# Patient Record
Sex: Male | Born: 1990 | Race: Black or African American | Hispanic: No | Marital: Single | Smoking: Former smoker
Health system: Southern US, Community
[De-identification: ages and names within clinical notes are randomized; demographics above are authoritative.]

## PROBLEM LIST (undated history)

## (undated) DIAGNOSIS — J45909 Unspecified asthma, uncomplicated: Secondary | ICD-10-CM

---
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
## 2012-04-25 ENCOUNTER — Emergency Department: Payer: Self-pay | Admitting: Emergency Medicine

## 2012-04-25 LAB — CBC
HCT: 46.5 % (ref 40.0–52.0)
MCH: 28.9 pg (ref 26.0–34.0)
MCHC: 33.6 g/dL (ref 32.0–36.0)
MCV: 86 fL (ref 80–100)
Platelet: 266 10*3/uL (ref 150–440)
RDW: 14.1 % (ref 11.5–14.5)
WBC: 6.6 10*3/uL (ref 3.8–10.6)

## 2012-04-25 LAB — COMPREHENSIVE METABOLIC PANEL
Albumin: 4 g/dL (ref 3.8–5.6)
Alkaline Phosphatase: 67 U/L — ABNORMAL LOW (ref 98–317)
Anion Gap: 8 (ref 7–16)
BUN: 16 mg/dL (ref 7–18)
Calcium, Total: 8.8 mg/dL — ABNORMAL LOW (ref 9.0–10.7)
Co2: 24 mmol/L (ref 21–32)
Creatinine: 1.39 mg/dL — ABNORMAL HIGH (ref 0.60–1.30)
EGFR (Non-African Amer.): >60
Osmolality: 277 (ref 275–301)
Potassium: 3.8 mmol/L (ref 3.5–5.1)
SGOT(AST): 43 U/L — ABNORMAL HIGH (ref 10–41)
Sodium: 138 mmol/L (ref 136–145)
Total Protein: 7.7 g/dL (ref 6.4–8.6)

## 2012-04-28 LAB — BETA STREP CULTURE(ARMC)

## 2012-05-01 LAB — CULTURE, BLOOD (SINGLE)

## 2012-07-30 ENCOUNTER — Emergency Department: Payer: Self-pay | Admitting: Emergency Medicine

## 2012-08-27 ENCOUNTER — Emergency Department: Payer: Self-pay | Admitting: Emergency Medicine

## 2012-08-27 LAB — TROPONIN I: Troponin-I: <0.02 ng/mL

## 2012-08-27 LAB — BASIC METABOLIC PANEL
Anion Gap: 9 (ref 7–16)
BUN: 24 mg/dL — ABNORMAL HIGH (ref 7–18)
Chloride: 110 mmol/L — ABNORMAL HIGH (ref 98–107)
Co2: 21 mmol/L (ref 21–32)
Creatinine: 1.12 mg/dL (ref 0.60–1.30)
EGFR (African American): >60
EGFR (Non-African Amer.): >60
Glucose: 93 mg/dL (ref 65–99)
Osmolality: 283 (ref 275–301)
Potassium: 4.1 mmol/L (ref 3.5–5.1)

## 2012-08-27 LAB — CBC
HCT: 44.2 % (ref 40.0–52.0)
MCH: 28.8 pg (ref 26.0–34.0)
MCHC: 34.5 g/dL (ref 32.0–36.0)
Platelet: 277 10*3/uL (ref 150–440)
RBC: 5.29 10*6/uL (ref 4.40–5.90)

## 2012-08-27 LAB — CK TOTAL AND CKMB (NOT AT ARMC): CK, Total: 527 U/L — ABNORMAL HIGH (ref 35–232)

## 2012-10-08 ENCOUNTER — Emergency Department: Payer: Self-pay | Admitting: Emergency Medicine

## 2014-04-01 IMAGING — CR DG CHEST 2V
1 series · 2 of 2 positions shown · non-contrast
Comparison: none

REASON FOR EXAM: chest pain
COMMENTS:   May transport without cardiac monitor

PROCEDURE:     DXR - DXR CHEST PA (OR AP) AND LATERAL  - August 27, 2012 [DATE]
RESULT:     Comparison: None

[Series 1: w chest pa · 0.14mm/px · 2 of 2 slices shown]
[im 1/2]
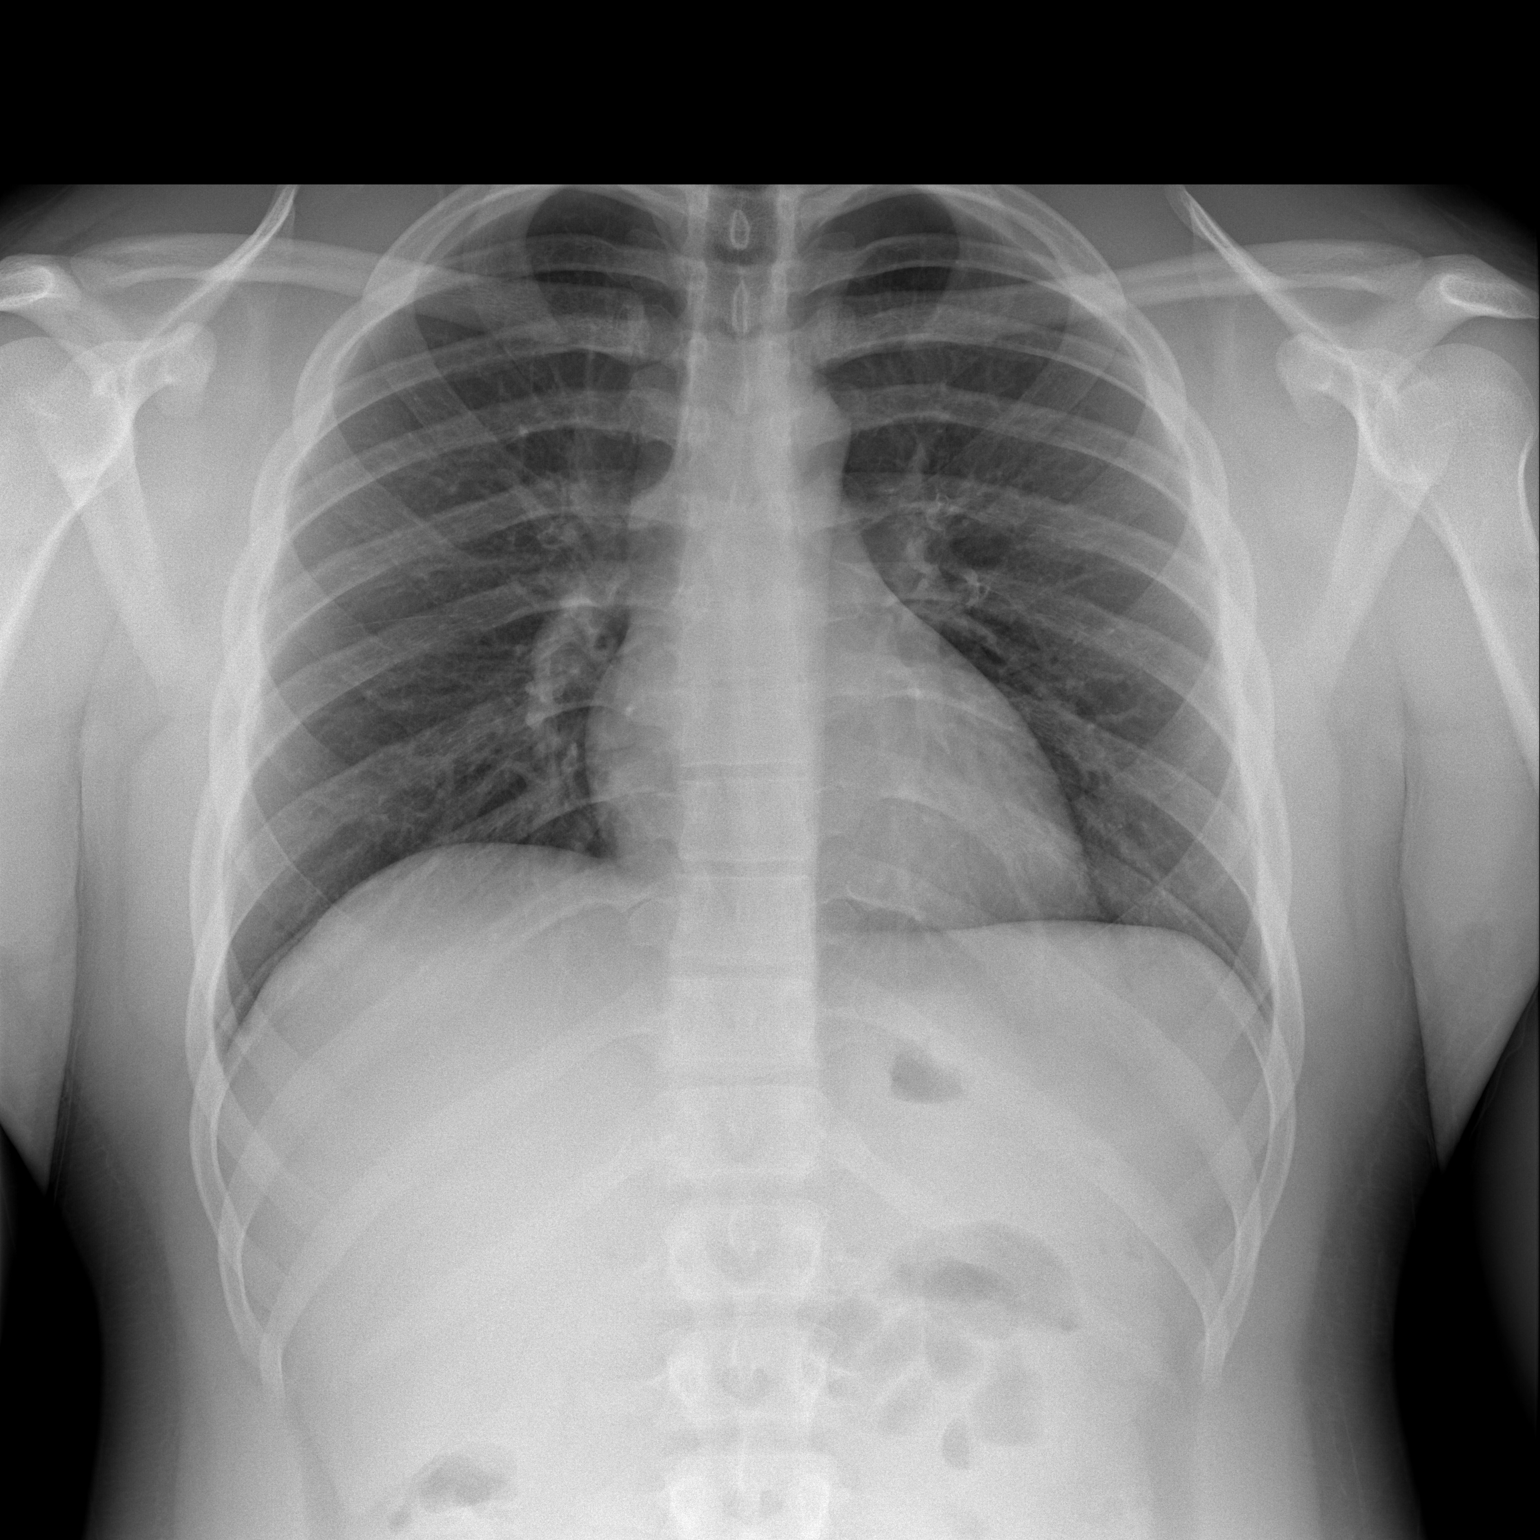
[im 2/2]
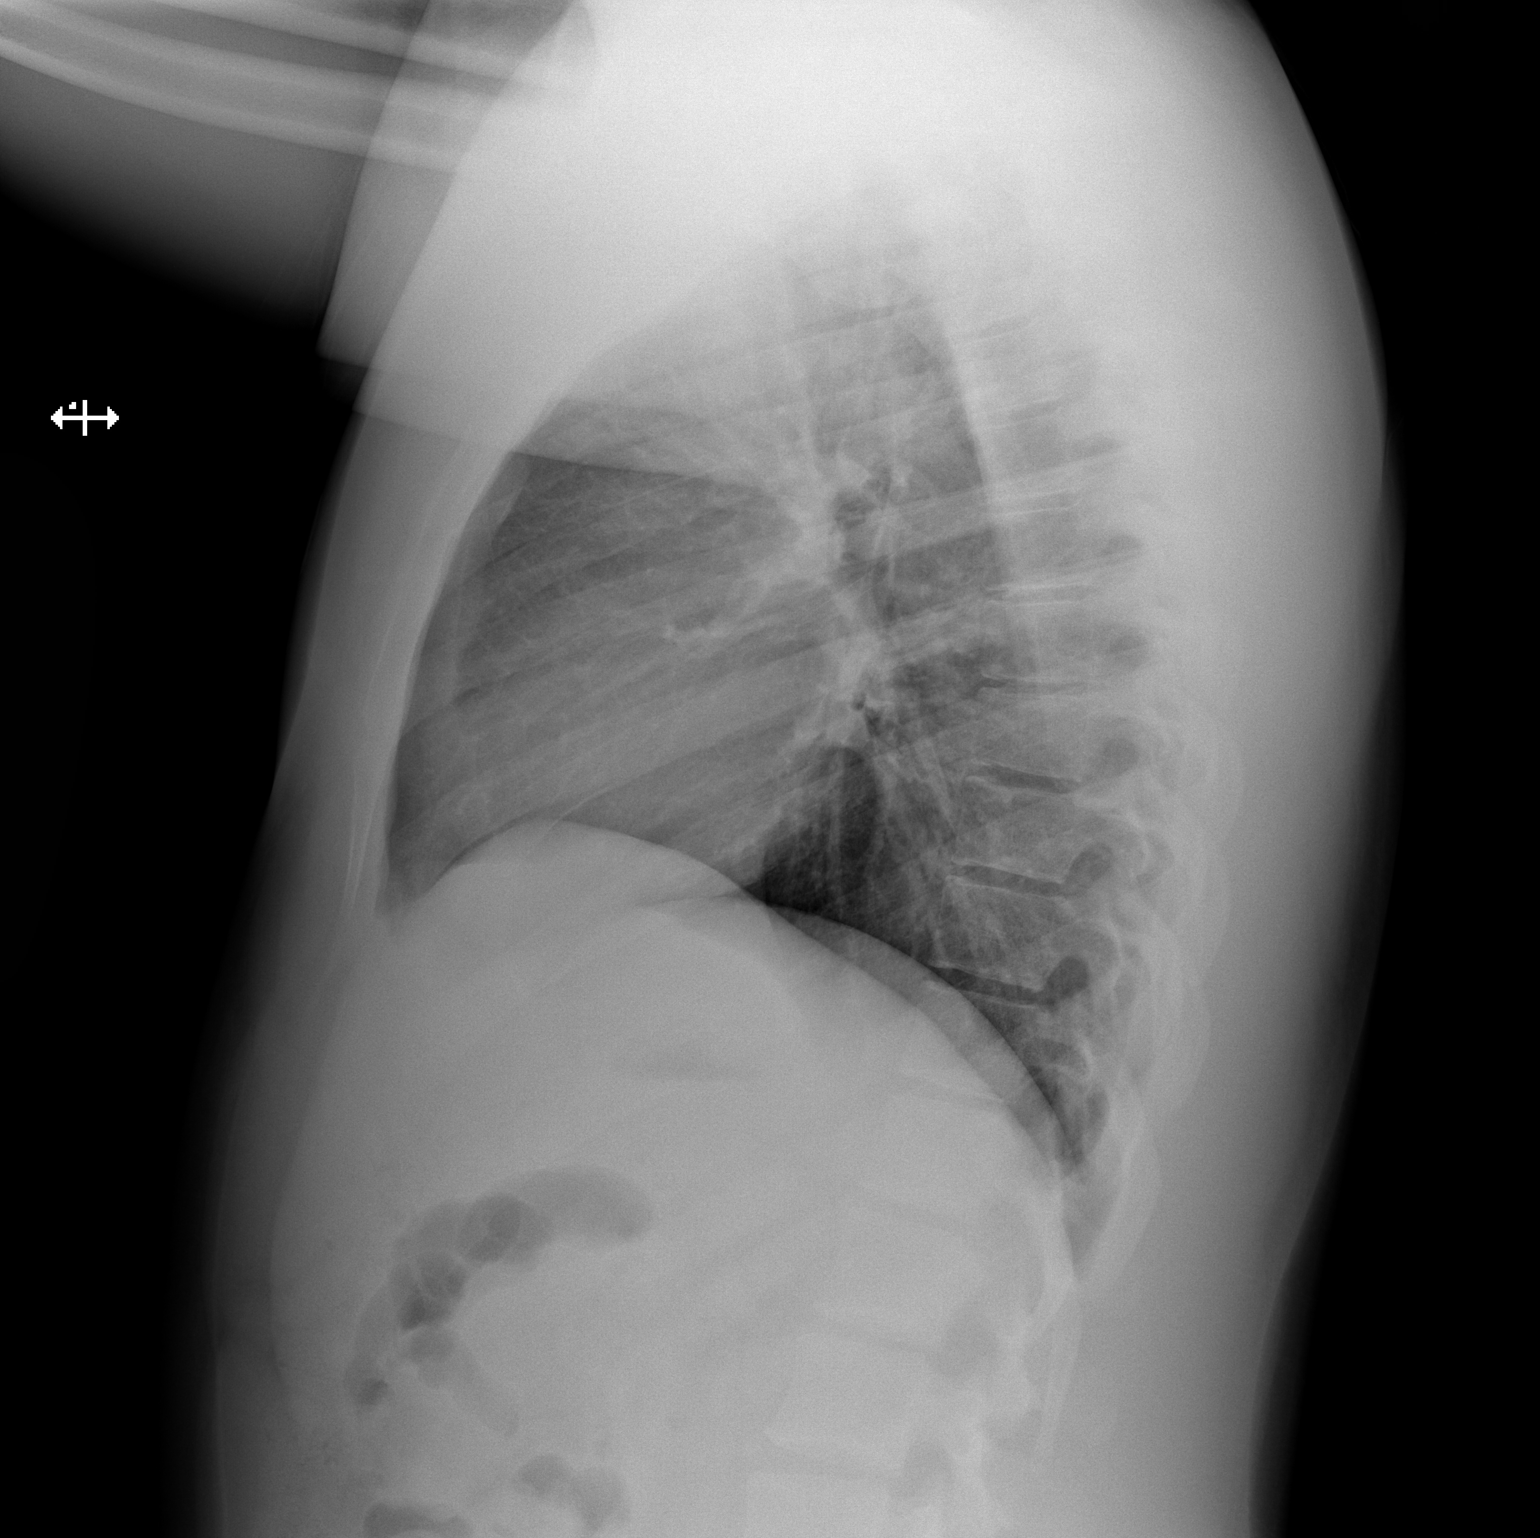

[2 of 2 positions shown; findings below may reference images not displayed]

FINDINGS: PA and lateral chest radiographs are provided.  There is no focal
parenchymal opacity, pleural effusion, or pneumothorax. The heart and
mediastinum are unremarkable.  The osseous structures are unremarkable.
IMPRESSION: No acute disease of the che[REDACTED]

## 2015-04-28 ENCOUNTER — Encounter: Payer: Self-pay | Admitting: Urgent Care

## 2015-04-28 ENCOUNTER — Emergency Department
Admission: EM | Admit: 2015-04-28 | Discharge: 2015-04-28 | Disposition: A | Discharge status: Home / Self Care | Payer: Medicaid Other | Attending: Emergency Medicine | Admitting: Emergency Medicine

## 2015-04-28 DIAGNOSIS — I10 Essential (primary) hypertension: Secondary | ICD-10-CM | POA: Insufficient documentation

## 2015-04-28 DIAGNOSIS — R040 Epistaxis: Secondary | ICD-10-CM | POA: Insufficient documentation

## 2015-04-28 HISTORY — DX: Unspecified asthma, uncomplicated: J45.909

## 2015-04-28 MED ORDER — HYDROCHLOROTHIAZIDE 12.5 MG PO TABS: 12.5000 mg | ORAL_TABLET | Freq: Every day | ORAL | Status: DC

## 2015-04-28 MED ORDER — OXYMETAZOLINE HCL 0.05 % NA SOLN
1.0000 | Freq: Once | NASAL | Status: AC
  Administered 2015-04-28: 1 via NASAL
  Filled 2015-04-28: qty 15

## 2015-04-28 MED ORDER — HYDROCHLOROTHIAZIDE 25 MG PO TABS
12.5000 mg | ORAL_TABLET | Freq: Once | ORAL | Status: AC
Start: 2015-04-28 — End: 2015-04-28
  Administered 2015-04-28: 12.5 mg via ORAL
  Filled 2015-04-28: qty 0.5

## 2015-04-28 NOTE — ED Provider Notes (Signed)
[facility] Emergency Department Provider Note  Time seen: 8:45 PM  I have reviewed the triage vital signs and the nursing notes.   HISTORY  Chief Complaint Epistaxis    HPI Darren Kim is a 24 y.o. male with a past medical history of hypertension presents the emergency department with elevated blood pressure and. According to the patient for the past 3 days his blood pressures been elevated in the 150s. Also for the last 3 days he has had an intermittent nosebleed which he states is on the left side nostril. No bleeding currently. However he came to the emergency department today because he sneezed and had a fairly large nosebleed. Denies any blood thinners, denies any aspirin use. Denies any dizziness or lightheadedness. Does state a mild headache currently. Patient sees [facility] is his primary care physician.     Past Medical History  Diagnosis Date  . Asthma     There are no active problems to display for this patient.   History reviewed. No pertinent past surgical history.  No current outpatient prescriptions on file.  Allergies Review of patient's allergies indicates no known allergies.  No family history on file.  Social History Social History  Substance Use Topics  . Smoking status: Never Smoker   . Smokeless tobacco: None  . Alcohol Use: Yes    Review of Systems Constitutional: Negative for fever. Cardiovascular: Negative for chest pain. Respiratory: Negative for shortness of breath. Gastrointestinal: Negative for abdominal pain Neurological: Negative for headache 10-point ROS otherwise negative.  ____________________________________________   PHYSICAL EXAM:  VITAL SIGNS: ED Triage Vitals  Enc Vitals Group     BP 04/28/15 2006 143/83 mmHg     Pulse Rate 04/28/15 2006 85     Resp 04/28/15 2006 16     Temp 04/28/15 2006 98.3 F (36.8 C)     Temp Source 04/28/15 2006 Oral     SpO2 04/28/15 2006 96 %   Weight 04/28/15 2006 218 lb (98.884 kg)     Height 04/28/15 2006  (1.676 m)     Head Cir --      Peak Flow --      Pain Score 04/28/15 2007 8     Pain Loc --      Pain Edu? --      Excl. in GC? --     Constitutional: Alert and oriented. Well appearing and in no distress. Eyes: Normal exam ENT   Head: Normocephalic and atraumatic.   Mouth/Throat: Mucous membranes are moist. Small amount of dried blood in the left nares, no active bleeding. No other abnormalities noted. Cardiovascular: Normal rate, regular rhythm. No murmur Respiratory: Normal respiratory effort without tachypnea nor retractions. Breath sounds are clear  Gastrointestinal: Soft and nontender. No distention.   Musculoskeletal: Nontender with normal range of motion in all extremities.  Neurologic:  Normal speech and language. No gross focal neurologic deficits Skin:  Skin is warm, dry and intact.  Psychiatric: Mood and affect are normal. Speech and behavior are normal.  ____________________________________________    INITIAL IMPRESSION / ASSESSMENT AND PLAN / ED COURSE  Pertinent labs & imaging results that were available during my care of the patient were reviewed by me and considered in my medical decision making (see chart for details).  Patient presents with epistaxis as well as hypertension. States his blood pressure has been elevated in the past these never been prescribed a medication. Given his epistaxis we'll provide the patient with  Afrin which he can take, fevers any further nosebleeds, and described the patient had used the Afrin clamp his nose for 15 minutes without checking. Patient is agreeable. We'll also start the patient hydrochlorothiazide, patient is to follow-up with his primary care physician in 2 weeks for recheck. Patient is agreeable to plan.  ____________________________________________   FINAL CLINICAL IMPRESSION(S) / ED DIAGNOSES  Hypertension Epistaxis   Minna AntisKevin Maela Takeda,  MD 04/28/15 2048

## 2015-04-28 NOTE — ED Notes (Addendum)
Patient presents with reports of epistaxis events for the last three days. Patient has been bleeding from both nares; no active bleeding at present. Also reporting elevated BP at home; BPs report in the 150s / low 100s; presents 143/83 tonight in triage.

## 2015-04-28 NOTE — Discharge Instructions (Signed)
Hypertension °Hypertension, commonly called high blood pressure, is when the force of blood pumping through your arteries is too strong. Your arteries are the blood vessels that carry blood from your heart throughout your body. A blood pressure reading consists of a higher number over a lower number, such as 110/72. The higher number (systolic) is the pressure inside your arteries when your heart pumps. The lower number (diastolic) is the pressure inside your arteries when your heart relaxes. Ideally you want your blood pressure below 120/80. °Hypertension forces your heart to work harder to pump blood. Your arteries may become narrow or stiff. Having untreated or uncontrolled hypertension can cause heart attack, stroke, kidney disease, and other problems. °RISK FACTORS °Some risk factors for high blood pressure are controllable. Others are not.  °Risk factors you cannot control include:  °· Race. You may be at higher risk if you are African American. °· Age. Risk increases with age. °· Gender. Men are at higher risk than women before age 45 years. After age 65, women are at higher risk than men. °Risk factors you can control include: °· Not getting enough exercise or physical activity. °· Being overweight. °· Getting too much fat, sugar, calories, or salt in your diet. °· Drinking too much alcohol. °SIGNS AND SYMPTOMS °Hypertension does not usually cause signs or symptoms. Extremely high blood pressure (hypertensive crisis) may cause headache, anxiety, shortness of breath, and nosebleed. °DIAGNOSIS °To check if you have hypertension, your health care provider will measure your blood pressure while you are seated, with your arm held at the level of your heart. It should be measured at least twice using the same arm. Certain conditions can cause a difference in blood pressure between your right and left arms. A blood pressure reading that is higher than normal on one occasion does not mean that you need treatment. If  it is not clear whether you have high blood pressure, you may be asked to return on a different day to have your blood pressure checked again. Or, you may be asked to monitor your blood pressure at home for 1 or more weeks. °TREATMENT °Treating high blood pressure includes making lifestyle changes and possibly taking medicine. Living a healthy lifestyle can help lower high blood pressure. You may need to change some of your habits. °Lifestyle changes may include: °· Following the DASH diet. This diet is high in fruits, vegetables, and whole grains. It is low in salt, red meat, and added sugars. °· Keep your sodium intake below 2,300 mg per day. °· Getting at least 30-45 minutes of aerobic exercise at least 4 times per week. °· Losing weight if necessary. °· Not smoking. °· Limiting alcoholic beverages. °· Learning ways to reduce stress. °Your health care provider may prescribe medicine if lifestyle changes are not enough to get your blood pressure under control, and if one of the following is true: °· You are 18-59 years of age and your systolic blood pressure is above 140. °· You are 60 years of age or older, and your systolic blood pressure is above 150. °· Your diastolic blood pressure is above 90. °· You have diabetes, and your systolic blood pressure is over 140 or your diastolic blood pressure is over 90. °· You have kidney disease and your blood pressure is above 140/90. °· You have heart disease and your blood pressure is above 140/90. °Your personal target blood pressure may vary depending on your medical conditions, your age, and other factors. °HOME CARE INSTRUCTIONS °·   Have your blood pressure rechecked as directed by your health care provider.   °· Take medicines only as directed by your health care provider. Follow the directions carefully. Blood pressure medicines must be taken as prescribed. The medicine does not work as well when you skip doses. Skipping doses also puts you at risk for  problems. °· Do not smoke.   °· Monitor your blood pressure at home as directed by your health care provider.  °SEEK MEDICAL CARE IF:  °· You think you are having a reaction to medicines taken. °· You have recurrent headaches or feel dizzy. °· You have swelling in your ankles. °· You have trouble with your vision. °SEEK IMMEDIATE MEDICAL CARE IF: °· You develop a severe headache or confusion. °· You have unusual weakness, numbness, or feel faint. °· You have severe chest or abdominal pain. °· You vomit repeatedly. °· You have trouble breathing. °MAKE SURE YOU:  °· Understand these instructions. °· Will watch your condition. °· Will get help right away if you are not doing well or get worse. °  °This information is not intended to replace advice given to you by your health care provider. Make sure you discuss any questions you have with your health care provider. °  °Document Released: 04/16/2005 Document Revised: 08/31/2014 Document Reviewed: 02/06/2013 °Elsevier Interactive Patient Education ©2016 [employer] ° °Nosebleed °Nosebleeds are common. They are due to a crack in the inside lining of your nose (mucous membrane) or from a small blood vessel that starts to bleed. Nosebleeds can be caused by many conditions, such as injury, infections, dry mucous membranes or dry climate, medicines, nose picking, and home heating and cooling systems. Most nosebleeds come from blood vessels in the front of your nose. °HOME CARE INSTRUCTIONS  °· Try controlling your nosebleed by pinching your nostrils gently and continuously for at least 10 minutes. °· Avoid blowing or sniffing your nose for a number of hours after having a nosebleed. °· Do not put gauze inside your nose yourself. If your nose was packed by your health care provider, try to maintain the pack inside of your nose until your health care provider removes it. °¨ If a gauze pack was used and it starts to fall out, gently replace it or cut off the end of it. °¨ If a  balloon catheter was used to pack your nose, do not cut or remove it unless your health care provider has instructed you to do that. °· Avoid lying down while you are having a nosebleed. Sit up and lean forward. °· Use a nasal spray decongestant to help with a nosebleed as directed by your health care provider. °· Do not use petroleum jelly or mineral oil in your nose. These can drip into your lungs. °· Maintain humidity in your home by using less air conditioning or by using a humidifier. °· Aspirin and blood thinners make bleeding more likely. If you are prescribed these medicines and you suffer from nosebleeds, ask your health care provider if you should stop taking the medicines or adjust the dose. Do not stop medicines unless directed by your health care provider °· Resume your normal activities as you are able, but avoid straining, lifting, or bending at the waist for several days. °· If your nosebleed was caused by dry mucous membranes, use over-the-counter saline nasal spray or gel. This will keep the mucous membranes moist and allow them to heal. If you must use a lubricant, choose the water-soluble variety. Use it only sparingly, and   do not use it within several hours of lying down. °· Keep all follow-up visits as directed by your health care provider. This is important. °SEEK MEDICAL CARE IF: °· You have a fever. °· You get frequent nosebleeds. °· You are getting nosebleeds more often. °SEEK IMMEDIATE MEDICAL CARE IF: °· Your nosebleed lasts longer than 20 minutes. °· Your nosebleed occurs after an injury to your face, and your nose looks crooked or broken. °· You have unusual bleeding from other parts of your body. °· You have unusual bruising on other parts of your body. °· You feel light-headed or you faint. °· You become sweaty. °· You vomit blood. °· Your nosebleed occurs after a head injury. °  °This information is not intended to replace advice given to you by your health care provider. Make sure  you discuss any questions you have with your health care provider. °  °Document Released: 01/24/2005 Document Revised: 05/07/2014 Document Reviewed: 11/30/2013 °Elsevier Interactive Patient Education ©2016 Elsevier Inc. ° °

## 2016-10-03 ENCOUNTER — Emergency Department: Payer: Self-pay

## 2016-10-03 ENCOUNTER — Encounter: Payer: Self-pay | Admitting: Emergency Medicine

## 2016-10-03 ENCOUNTER — Emergency Department
Admission: EM | Admit: 2016-10-03 | Discharge: 2016-10-03 | Disposition: A | Discharge status: Home / Self Care | Payer: Self-pay | Attending: Emergency Medicine | Admitting: Emergency Medicine

## 2016-10-03 DIAGNOSIS — Z87891 Personal history of nicotine dependence: Secondary | ICD-10-CM | POA: Insufficient documentation

## 2016-10-03 DIAGNOSIS — J45998 Other asthma: Secondary | ICD-10-CM | POA: Insufficient documentation

## 2016-10-03 DIAGNOSIS — J45909 Unspecified asthma, uncomplicated: Secondary | ICD-10-CM

## 2016-10-03 DIAGNOSIS — Z79899 Other long term (current) drug therapy: Secondary | ICD-10-CM | POA: Insufficient documentation

## 2016-10-03 DIAGNOSIS — R0789 Other chest pain: Secondary | ICD-10-CM

## 2016-10-03 LAB — COMPREHENSIVE METABOLIC PANEL
ALT: 45 U/L (ref 17–63)
AST: 36 U/L (ref 15–41)
Albumin: 4.1 g/dL (ref 3.5–5.0)
Alkaline Phosphatase: 43 U/L (ref 38–126)
Anion gap: 11 (ref 5–15)
BUN: 24 mg/dL — ABNORMAL HIGH (ref 6–20)
CHLORIDE: 107 mmol/L (ref 101–111)
CO2: 21 mmol/L — ABNORMAL LOW (ref 22–32)
Calcium: 9.3 mg/dL (ref 8.9–10.3)
Creatinine, Ser: 1.41 mg/dL — ABNORMAL HIGH (ref 0.61–1.24)
GFR calc Af Amer: >60 mL/min (ref >60)
Glucose, Bld: 93 mg/dL (ref 65–99)
Potassium: 3.9 mmol/L (ref 3.5–5.1)
Sodium: 139 mmol/L (ref 135–145)
Total Bilirubin: 1.3 mg/dL — ABNORMAL HIGH (ref 0.3–1.2)
Total Protein: 7.5 g/dL (ref 6.5–8.1)

## 2016-10-03 LAB — URINALYSIS, COMPLETE (UACMP) WITH MICROSCOPIC
BACTERIA UA: NONE SEEN
Bilirubin Urine: NEGATIVE
Glucose, UA: NEGATIVE mg/dL
HGB URINE DIPSTICK: NEGATIVE
Ketones, ur: NEGATIVE mg/dL
Leukocytes, UA: NEGATIVE
Nitrite: NEGATIVE
Protein, ur: NEGATIVE mg/dL
SPECIFIC GRAVITY, URINE: 1.011 (ref 1.005–1.030)
Squamous Epithelial / LPF: NONE SEEN
pH: 6 (ref 5.0–8.0)

## 2016-10-03 LAB — CBC WITH DIFFERENTIAL/PLATELET
Basophils Absolute: 0.1 10*3/uL (ref 0–0.1)
Basophils Relative: 1 %
Eosinophils Absolute: 0.1 10*3/uL (ref 0–0.7)
Eosinophils Relative: 1 %
HEMATOCRIT: 50.7 % (ref 40.0–52.0)
HEMOGLOBIN: 18.1 g/dL — AB (ref 13.0–18.0)
LYMPHS ABS: 5.1 10*3/uL — AB (ref 1.0–3.6)
LYMPHS PCT: 49 %
MCH: 29.9 pg (ref 26.0–34.0)
MCHC: 35.8 g/dL (ref 32.0–36.0)
MCV: 83.6 fL (ref 80.0–100.0)
Monocytes Absolute: 0.6 10*3/uL (ref 0.2–1.0)
Monocytes Relative: 6 %
NEUTROS ABS: 4.4 10*3/uL (ref 1.4–6.5)
NEUTROS PCT: 43 %
Platelets: 302 10*3/uL (ref 150–440)
RBC: 6.06 MIL/uL — AB (ref 4.40–5.90)
RDW: 13.7 % (ref 11.5–14.5)
WBC: 10.2 10*3/uL (ref 3.8–10.6)

## 2016-10-03 LAB — URINE DRUG SCREEN, QUALITATIVE (ARMC ONLY)
Amphetamines, Ur Screen: NOT DETECTED
Barbiturates, Ur Screen: NOT DETECTED
Benzodiazepine, Ur Scrn: NOT DETECTED
CANNABINOID 50 NG, UR ~~LOC~~: NOT DETECTED
Cocaine Metabolite,Ur ~~LOC~~: NOT DETECTED
MDMA (ECSTASY) UR SCREEN: NOT DETECTED
Methadone Scn, Ur: NOT DETECTED
Opiate, Ur Screen: POSITIVE — AB
Phencyclidine (PCP) Ur S: NOT DETECTED
Tricyclic, Ur Screen: NOT DETECTED

## 2016-10-03 LAB — ETHANOL

## 2016-10-03 LAB — TROPONIN I

## 2016-10-03 LAB — FIBRIN DERIVATIVES D-DIMER (ARMC ONLY): FIBRIN DERIVATIVES D-DIMER (ARMC): 145.57 (ref 0.00–499.00)

## 2016-10-03 MED ORDER — SODIUM CHLORIDE 0.9 % IV BOLUS (SEPSIS)
500.0000 mL | Freq: Once | INTRAVENOUS | Status: AC
  Administered 2016-10-03: 500 mL via INTRAVENOUS

## 2016-10-03 MED ORDER — KETOROLAC TROMETHAMINE 30 MG/ML IJ SOLN
15.0000 mg | Freq: Once | INTRAMUSCULAR | Status: AC
  Administered 2016-10-03: 15 mg via INTRAVENOUS
  Filled 2016-10-03: qty 1

## 2016-10-03 MED ORDER — PREDNISONE 20 MG PO TABS: 60.0000 mg | ORAL_TABLET | Freq: Every day | ORAL | 0 refills | Status: DC

## 2016-10-03 MED ORDER — NAPROXEN SODIUM 220 MG PO TABS: 220.0000 mg | ORAL_TABLET | Freq: Two times a day (BID) | ORAL | 0 refills | Status: DC

## 2016-10-03 MED ORDER — IPRATROPIUM-ALBUTEROL 0.5-2.5 (3) MG/3ML IN SOLN
3.0000 mL | Freq: Once | RESPIRATORY_TRACT | Status: AC
  Administered 2016-10-03: 3 mL via RESPIRATORY_TRACT

## 2016-10-03 MED ORDER — ALBUTEROL SULFATE HFA 108 (90 BASE) MCG/ACT IN AERS: 2.0000 | INHALATION_SPRAY | Freq: Four times a day (QID) | RESPIRATORY_TRACT | 2 refills | Status: DC | PRN

## 2016-10-03 MED ORDER — IPRATROPIUM-ALBUTEROL 0.5-2.5 (3) MG/3ML IN SOLN
RESPIRATORY_TRACT | Status: AC
  Filled 2016-10-03: qty 3

## 2016-10-03 MED ORDER — MORPHINE SULFATE (PF) 4 MG/ML IV SOLN
4.0000 mg | Freq: Once | INTRAVENOUS | Status: AC
  Administered 2016-10-03: 4 mg via INTRAVENOUS
  Filled 2016-10-03: qty 1

## 2016-10-03 MED ORDER — IPRATROPIUM-ALBUTEROL 0.5-2.5 (3) MG/3ML IN SOLN
RESPIRATORY_TRACT | Status: AC
  Administered 2016-10-03: 3 mL via RESPIRATORY_TRACT
  Filled 2016-10-03: qty 3

## 2016-10-03 MED ORDER — OXYCODONE-ACETAMINOPHEN 5-325 MG PO TABS: 1.0000 | ORAL_TABLET | Freq: Four times a day (QID) | ORAL | 0 refills | Status: DC | PRN

## 2016-10-03 NOTE — ED Provider Notes (Addendum)
[facility] Emergency Department Provider Note  ____________________________________________   I have reviewed the triage vital signs and the nursing notes.   HISTORY  Chief Complaint Shortness of Breath    HPI Darren Kim is a 26 y.o. male who has a history of asthma, as well as a daily dose of HCTZ for presumable hypertension, presents today complaining of chest pain. Patient has had asthma off and on for several weeks, and he has a recurrent pain in his right chest wall which is worse when he coughs and changes position or touches it. He states it happened when he was coughing a few weeks ago and seems against getting worse and not better. His been constant. Worse over the last 2 days. The sharp nonradiating pain. Discussed by the exact spot where it hurts. Is worse when he changes position as noted above. Nothing else makes it better side lying still and not coughing. He denies any fever or productive cough. He states his asthma is mild at this time. He has no leg swelling. No recent travel, no personal or family history of PE or DVT. The pain is uncomfortable for him. He denies any recent surgery. He has no family history of early cardiac death.  Denies cocaine abuse.   Past Medical History:  Diagnosis Date  . Asthma     There are no active problems to display for this patient.   History reviewed. No pertinent surgical history.  Prior to Admission medications   Medication Sig Start Date End Date Taking? Authorizing Provider  hydrochlorothiazide (HYDRODIURIL) 12.5 MG tablet Take 1 tablet (12.5 mg total) by mouth daily. 04/28/15   Minna Antis, MD    Allergies Patient has no known allergies.  No family history on file.  Social History Social History  Substance Use Topics  . Smoking status: Former Smoker    Types: Cigarettes    Quit date: 09/12/2016  . Smokeless tobacco: Not on file  . Alcohol use Yes    Review of  Systems Constitutional: No fever/chills Eyes: No visual changes. ENT: No sore throat. No stiff neck no neck pain Cardiovascular: Positive chest pain. Respiratory: Positive mild shortness of breath. Gastrointestinal:   no vomiting.  No diarrhea.  No constipation. Genitourinary: Negative for dysuria. Musculoskeletal: Negative lower extremity swelling Skin: Negative for rash. Neurological: Negative for severe headaches, focal weakness or numbness.   ____________________________________________   PHYSICAL EXAM:  VITAL SIGNS: ED Triage Vitals  Enc Vitals Group     BP 10/03/16 0829 (!) 124/92     Pulse Rate 10/03/16 0829 75     Resp 10/03/16 0829 20     Temp 10/03/16 0829 98.2 F (36.8 C)     Temp Source 10/03/16 0829 Oral     SpO2 10/03/16 0829 100 %     Weight 10/03/16 0831 227 lb (103 kg)     Height 10/03/16 0831 5\' 6"  (1.676 m)     Head Circumference --      Peak Flow --      Pain Score 10/03/16 0829 10     Pain Loc --      Pain Edu? --      Excl. in GC? --     Constitutional: Alert and oriented. Well appearing and in no acute distress.Patient is anxious and upset but nontoxic medically Eyes: Conjunctivae are normal Head: Atraumatic HEENT: No congestion/rhinnorhea. Mucous membranes are moist.  Oropharynx non-erythematous Neck:   Nontender with no meningismus, no masses, no stridor  Cardiovascular: Normal rate, regular rhythm. Grossly normal heart sounds.  Good peripheral circulation. Respiratory: Normal respiratory effort.  No retractions. Lungs occasional slight bilateral wheeze,  chest: Focal tenderness or right chest wall and I touch that area patient states "that's the pain right there" and pulls back. There is no crepitus, there is no flail chest, there is no lesions or inflammation noted there is no rib fracture palpated. Pain is also worse when the patient pulls up in the bed.  Abdominal: Soft and nontender. No distention. No guarding no rebound Back:  There is no  focal tenderness or step off.  there is no midline tenderness there are no lesions noted. there is no CVA tenderness Musculoskeletal: No lower extremity tenderness, no upper extremity tenderness. No joint effusions, no DVT signs strong distal pulses no edema Neurologic:  Normal speech and language. No gross focal neurologic deficits are appreciated.  Skin:  Skin is warm, dry and intact. No rash noted. Psychiatric: Mood and affect are normal. Speech and behavior are normal.  ____________________________________________   LABS (all labs ordered are listed, but only abnormal results are displayed)  Labs Reviewed  CBC WITH DIFFERENTIAL/PLATELET  TROPONIN I  COMPREHENSIVE METABOLIC PANEL  ETHANOL  URINE DRUG SCREEN, QUALITATIVE ([facility] ONLY)  URINALYSIS, COMPLETE ([facility]) WITH MICROSCOPIC  CBC WITH DIFFERENTIAL/PLATELET  FIBRIN DERIVATIVES D-DIMER ([facility] ONLY)   ____________________________________________  EKG  I personally interpreted any EKGs ordered by me or triage  ____________________________________________  RADIOLOGY  I reviewed any imaging ordered by me or triage that were performed during my shift and, if possible, patient and/or family made aware of any abnormal findings. ____________________________________________   PROCEDURES  Procedure(s) performed: None  Procedures  Critical Care performed: None  ____________________________________________   INITIAL IMPRESSION / ASSESSMENT AND PLAN / ED COURSE  Pertinent labs & imaging results that were available during my care of the patient were reviewed by me and considered in my medical decision making (see chart for details).  Patient with asthma, and very reproducible chest wall pain, very low suspicion for ACS PE or dissection however, we'll obtain chest x-ray and I will send a d-dimer. Patient does have mild asthma at this time we are giving him a nebulizer, I will give him nonsteroidal and opiate pain medication for  his chest wall pain to see if this improves his symptoms, and we will observe him closely in the emergency department. Vital signs and exam are quite reassuring and most consistent with chest wall pain in the context of cough   ----------------------------------------- 11:23 AM on 10/03/2016 -----------------------------------------  Patient no acute distress pain is gone, feels much better breathing easily,At this time, there does not appear to be clinical evidence to support the diagnosis of pulmonary embolus, dissection, myocarditis, endocarditis, pericarditis, pericardial tamponade, acute coronary syndrome, pneumothorax, pneumonia, or any other acute intrathoracic pathology that will require admission or acute intervention. Nor is there evidence of any significant intra-abdominal pathology causing this discomfort.  Counseled patient extensively on managing his pain, this is very clearly reproducible chest wall pain in the context of cough. We'll send her home with steroids and a refill on his albuterol should he need it sats are good, return precautions are given and understood. Patient was perked negative however I did do a d-dimer as a precaution which is reassuringly also negative. ____________________________________________   FINAL CLINICAL IMPRESSION(S) / ED DIAGNOSES  Final diagnoses:  None      This chart was dictated using voice recognition software.  Despite best efforts  to proofread,  errors can occur which can change meaning.      Jeanmarie PlantMcShane, Jacquelynn Friend A, MD 10/03/16 [id number]    Jeanmarie PlantMcShane, Deairra Halleck A, MD 10/03/16 1124

## 2016-10-03 NOTE — ED Triage Notes (Signed)
Patient from home complaining of chest pain, cough and SOB. Reports symptoms started several weeks ago but have gotten worse over the last few days. Patient reports pain is on right side, worse with deep inspiration and certain positions. Patient has history of asthma. A&O x4. Patient able to speak in complete sentences upon arrival.

## 2017-05-10 ENCOUNTER — Encounter: Payer: Self-pay | Admitting: Emergency Medicine

## 2017-05-10 ENCOUNTER — Emergency Department: Payer: 59

## 2017-05-10 ENCOUNTER — Emergency Department
Admission: EM | Admit: 2017-05-10 | Discharge: 2017-05-10 | Disposition: A | Discharge status: Home / Self Care | Payer: 59 | Attending: Emergency Medicine | Admitting: Emergency Medicine

## 2017-05-10 DIAGNOSIS — J209 Acute bronchitis, unspecified: Secondary | ICD-10-CM | POA: Diagnosis not present

## 2017-05-10 DIAGNOSIS — Z87891 Personal history of nicotine dependence: Secondary | ICD-10-CM | POA: Insufficient documentation

## 2017-05-10 DIAGNOSIS — J4521 Mild intermittent asthma with (acute) exacerbation: Secondary | ICD-10-CM | POA: Insufficient documentation

## 2017-05-10 DIAGNOSIS — Z79899 Other long term (current) drug therapy: Secondary | ICD-10-CM | POA: Insufficient documentation

## 2017-05-10 DIAGNOSIS — R05 Cough: Secondary | ICD-10-CM | POA: Diagnosis present

## 2017-05-10 LAB — URINALYSIS, COMPLETE (UACMP) WITH MICROSCOPIC
Bacteria, UA: NONE SEEN
Bilirubin Urine: NEGATIVE
GLUCOSE, UA: NEGATIVE mg/dL
Hgb urine dipstick: NEGATIVE
KETONES UR: NEGATIVE mg/dL
LEUKOCYTES UA: NEGATIVE
Nitrite: NEGATIVE
PH: 7 (ref 5.0–8.0)
Protein, ur: NEGATIVE mg/dL
SPECIFIC GRAVITY, URINE: 1.023 (ref 1.005–1.030)
SQUAMOUS EPITHELIAL / LPF: NONE SEEN

## 2017-05-10 LAB — GLUCOSE, CAPILLARY: Glucose-Capillary: 131 mg/dL — ABNORMAL HIGH (ref 65–99)

## 2017-05-10 LAB — INFLUENZA PANEL BY PCR (TYPE A & B)
INFLAPCR: NEGATIVE
INFLBPCR: NEGATIVE

## 2017-05-10 MED ORDER — IPRATROPIUM-ALBUTEROL 0.5-2.5 (3) MG/3ML IN SOLN
3.0000 mL | Freq: Once | RESPIRATORY_TRACT | Status: AC
  Administered 2017-05-10: 3 mL via RESPIRATORY_TRACT
  Filled 2017-05-10: qty 3

## 2017-05-10 MED ORDER — IPRATROPIUM-ALBUTEROL 0.5-2.5 (3) MG/3ML IN SOLN: 3.0000 mL | RESPIRATORY_TRACT | 3 refills | Status: DC | PRN

## 2017-05-10 MED ORDER — AZITHROMYCIN 250 MG PO TABS: ORAL_TABLET | ORAL | 0 refills | Status: DC

## 2017-05-10 MED ORDER — PREDNISONE 10 MG (21) PO TBPK: ORAL_TABLET | ORAL | 0 refills | Status: DC

## 2017-05-10 MED ORDER — HYDROCODONE-HOMATROPINE 5-1.5 MG/5ML PO SYRP: 5.0000 mL | ORAL_SOLUTION | Freq: Four times a day (QID) | ORAL | 0 refills | Status: DC | PRN

## 2017-05-10 NOTE — Discharge Instructions (Signed)
Follow-up with your regular doctor if you are not better in 3 days, use medication as prescribed, if you are worsening please return to the emergency department, drink plenty of fluids this will help with muscle aches

## 2017-05-10 NOTE — ED Triage Notes (Signed)
Pt arrived via EMS with cough and c/o cp.  Pt triage VS WNL. Family at bedside, pt states he has asthma hx and seasonal allergies and has been having trouble breathing.

## 2017-05-10 NOTE — ED Provider Notes (Signed)
[facility] Emergency Department Provider Note  ____________________________________________   First MD Initiated Contact with Patient 05/10/17 1559     (approximate)  I have reviewed the triage vital signs and the nursing notes.   HISTORY  Chief Complaint Cough    HPI Darren Kim is a 27 y.o. male states he [facility] and told that he probably had the flu, they had a quick flu and it was negative but they stated he had all his symptoms, he was given a prescription for Tamiflu which he did start, today he is complaining of cough and chest pain, he states he aches all over, he has history of asthma feels like he cannot breathe, he states he still has had a low-grade fever, he denies vomiting or diarrhea, he states it hurts all over if someone touches him, he arrived by EMS, the family came and is at bedside at this time Family states that he has been eating and drinking, the patient states he urinated about 17 times today   Past Medical History:  Diagnosis Date  . Asthma     There are no active problems to display for this patient.   History reviewed. No pertinent surgical history.  Prior to Admission medications   Medication Sig Start Date End Date Taking? Authorizing Provider  albuterol (PROVENTIL HFA;VENTOLIN HFA) 108 (90 Base) MCG/ACT inhaler Inhale 2 puffs into the lungs every 6 (six) hours as needed for wheezing or shortness of breath. 10/03/16   Jeanmarie PlantMcShane, James A, MD  azithromycin (ZITHROMAX Z-PAK) 250 MG tablet 2 pills today then 1 pill a day for 4 days 05/10/17   Sherrie MustacheFisher, Roselyn BeringSusan W, PA-C  cetirizine (ZYRTEC) 10 MG tablet Take 10 mg by mouth daily.    [provider]  fluticasone (FLONASE) 50 MCG/ACT nasal spray Place 2 sprays into both nostrils daily.    [provider]  hydrochlorothiazide (HYDRODIURIL) 12.5 MG tablet Take 1 tablet (12.5 mg total) by mouth daily. Patient not taking: Reported on 10/03/2016  04/28/15   Minna AntisPaduchowski, Kevin, MD  HYDROcodone-homatropine [facility](HYCODAN) 5-1.5 MG/5ML syrup Take 5 mLs by mouth every 6 (six) hours as needed for cough. 05/10/17   Bernarr Longsworth, Roselyn BeringSusan W, PA-C  ipratropium-albuterol (DUONEB) 0.5-2.5 (3) MG/3ML SOLN Take 3 mLs by nebulization every 4 (four) hours as needed. 05/10/17   Mohsen Odenthal, Roselyn BeringSusan W, PA-C  naproxen sodium (ALEVE) 220 MG tablet Take 1 tablet (220 mg total) by mouth 2 (two) times daily with a meal. 10/03/16   Jeanmarie PlantMcShane, James A, MD  oxyCODONE-acetaminophen (ROXICET) 5-325 MG tablet Take 1 tablet by mouth every 6 (six) hours as needed. 10/03/16   Jeanmarie PlantMcShane, James A, MD  predniSONE (STERAPRED UNI-PAK 21 TAB) 10 MG (21) TBPK tablet Take 6 pills on day one then decrease by 1 pill each day 05/10/17   Faythe GheeFisher, Stuart Guillen W, PA-C    Allergies Patient has no known allergies.  No family history on file.  Social History Social History   Tobacco Use  . Smoking status: Former Smoker    Types: Cigarettes    Last attempt to quit: 09/12/2016    Years since quitting: 0.6  . Smokeless tobacco: Never Used  Substance Use Topics  . Alcohol use: Yes  . Drug use: No    Review of Systems  Constitutional: Positive fever/chills Eyes: No visual changes. ENT: No sore throat. Respiratory: Positive cough, positive shortness of breath Genitourinary: Negative for dysuria. Musculoskeletal: Negative for back pain.  Positive  for muscle pain Skin: Negative for rash.    ____________________________________________   PHYSICAL EXAM:  VITAL SIGNS: ED Triage Vitals  Enc Vitals Group     BP 05/10/17 1427 129/79     Pulse Rate 05/10/17 1427 90     Resp 05/10/17 1427 20     Temp 05/10/17 1427 99.4 F (37.4 C)     Temp Source 05/10/17 1427 Oral     SpO2 05/10/17 1427 95 %     Weight 05/10/17 1427 227 lb (103 kg)     Height 05/10/17 1427 5\' 6"  (1.676 m)     Head Circumference --      Peak Flow --      Pain Score 05/10/17 1511 10     Pain Loc --      Pain Edu? --      Excl. in GC?  --     Constitutional: Alert and oriented.  Patient is lying on the stretcher with his head he pulled over his eyes, he flinches when his leg is touched, he appears to be exhausted, there is no audible wheezing  eyes: Conjunctivae are normal.  Head: Atraumatic. Nose: No congestion/rhinnorhea. Mouth/Throat: Mucous membranes are moist.  Throat is normal Cardiovascular: Normal rate, regular rhythm.  Heart sounds are normal Respiratory: Normal respiratory effort.  No retractions, lungs with diffuse wheezing GU: deferred Musculoskeletal: FROM all extremities, warm and well perfused Neurologic:  Normal speech and language.  Skin:  Skin is warm, dry and intact. No rash noted. Psychiatric: Mood and affect are normal. Speech and behavior are normal.  ____________________________________________   LABS (all labs ordered are listed, but only abnormal results are displayed)  Labs Reviewed  URINALYSIS, COMPLETE (UACMP) WITH MICROSCOPIC - Abnormal; Notable for the following components:      Result Value   Color, Urine YELLOW (*)    APPearance CLEAR (*)    All other components within normal limits  GLUCOSE, CAPILLARY - Abnormal; Notable for the following components:   Glucose-Capillary 131 (*)    All other components within normal limits  INFLUENZA PANEL BY PCR (TYPE A & B)   ____________________________________________   ____________________________________________  RADIOLOGY  Chest x-ray shows bronchial inflammation, no pneumonia  ____________________________________________   PROCEDURES  Procedure(s) performed: DuoNeb  Procedures    ____________________________________________   INITIAL IMPRESSION / ASSESSMENT AND PLAN / ED COURSE  Pertinent labs & imaging results that were available during my care of the patient were reviewed by me and considered in my medical decision making (see chart for details).  Patient is a 27 year old male that appears tired, he states he was  diagnosed with flu 2 days ago, he complains of chest pain or shortness of breath today, he denies vomiting or diarrhea  On physical exam he is afebrile at this time, his muscles are tender to palpation, he has diffuse wheezing in all lung fields  Ordered DuoNeb, chest x-ray, and repeat flu swab, along with a fingerstick glucose  ----------------------------------------- 6:00 PM on 05/10/2017 -----------------------------------------  Chest x-ray is negative for pneumonia, flu swab was negative for influenza, EKG was negative for STEMI per heart station Dr., UA is negative, fingerstick glucose was 131,  On reevaluation, the patient's lungs are clear to auscultation after using the DuoNeb, he states his chest feels better but is still sore, he denies any new symptoms, he appears better as in his breathing is better, he is still afebrile  Diagnosis is acute bronchitis, patient has a nebulizer machine he has access  to at home, he is given prescription for DuoNeb Nebules, Z-Pak, steroid pack, and Hycodan cough syrup, he is to rest and drink plenty of fluids, if he has a fever he should take Tylenol or ibuprofen, he is to return to the emergency department if he is worsening, the patient and family member state they understand, he will comply with our recommendations, he was discharged in stable condition     As part of my medical decision making, I reviewed the following data within the electronic MEDICAL RECORD NUMBER Labs reviewed , EKG interpreted by heart station doctor, Old chart reviewed, Radiograph reviewed; cxr is neg for pneumonia, Notes from prior ED visits and [place] Controlled Substance Database  ____________________________________________   FINAL CLINICAL IMPRESSION(S) / ED DIAGNOSES  Final diagnoses:  Acute bronchitis, unspecified organism  Mild intermittent asthma with exacerbation      NEW MEDICATIONS STARTED DURING THIS VISIT:  Discharge Medication List as of 05/10/2017  5:39 PM      START taking these medications   Details  azithromycin (ZITHROMAX Z-PAK) 250 MG tablet 2 pills today then 1 pill a day for 4 days, Print    HYDROcodone-homatropine (HYCODAN) 5-1.5 MG/5ML syrup Take 5 mLs by mouth every 6 (six) hours as needed for cough., Starting Fri 05/10/2017, Print    ipratropium-albuterol (DUONEB) 0.5-2.5 (3) MG/3ML SOLN Take 3 mLs by nebulization every 4 (four) hours as needed., Starting Fri 05/10/2017, Print    predniSONE (STERAPRED UNI-PAK 21 TAB) 10 MG (21) TBPK tablet Take 6 pills on day one then decrease by 1 pill each day, Print         Note:  This document was prepared using Dragon voice recognition software and may include unintentional dictation errors.    Faythe Ghee, PA-C 05/10/17 Flossie Buffy    Arnaldo Natal, MD 05/10/17 2055

## 2017-05-10 NOTE — ED Notes (Signed)
Pt in via EMS with c/o flu-like sx's and cough. Pt reports CP with coughing.

## 2018-01-17 ENCOUNTER — Ambulatory Visit: Payer: Self-pay | Admitting: Family Medicine

## 2018-05-08 IMAGING — CR DG CHEST 2V
2 series · 2 of 2 positions shown · non-contrast
Comparison: August 27, 2012

CLINICAL DATA: Shortness of Breath

EXAM:
CHEST  2 VIEW

[chest lat]
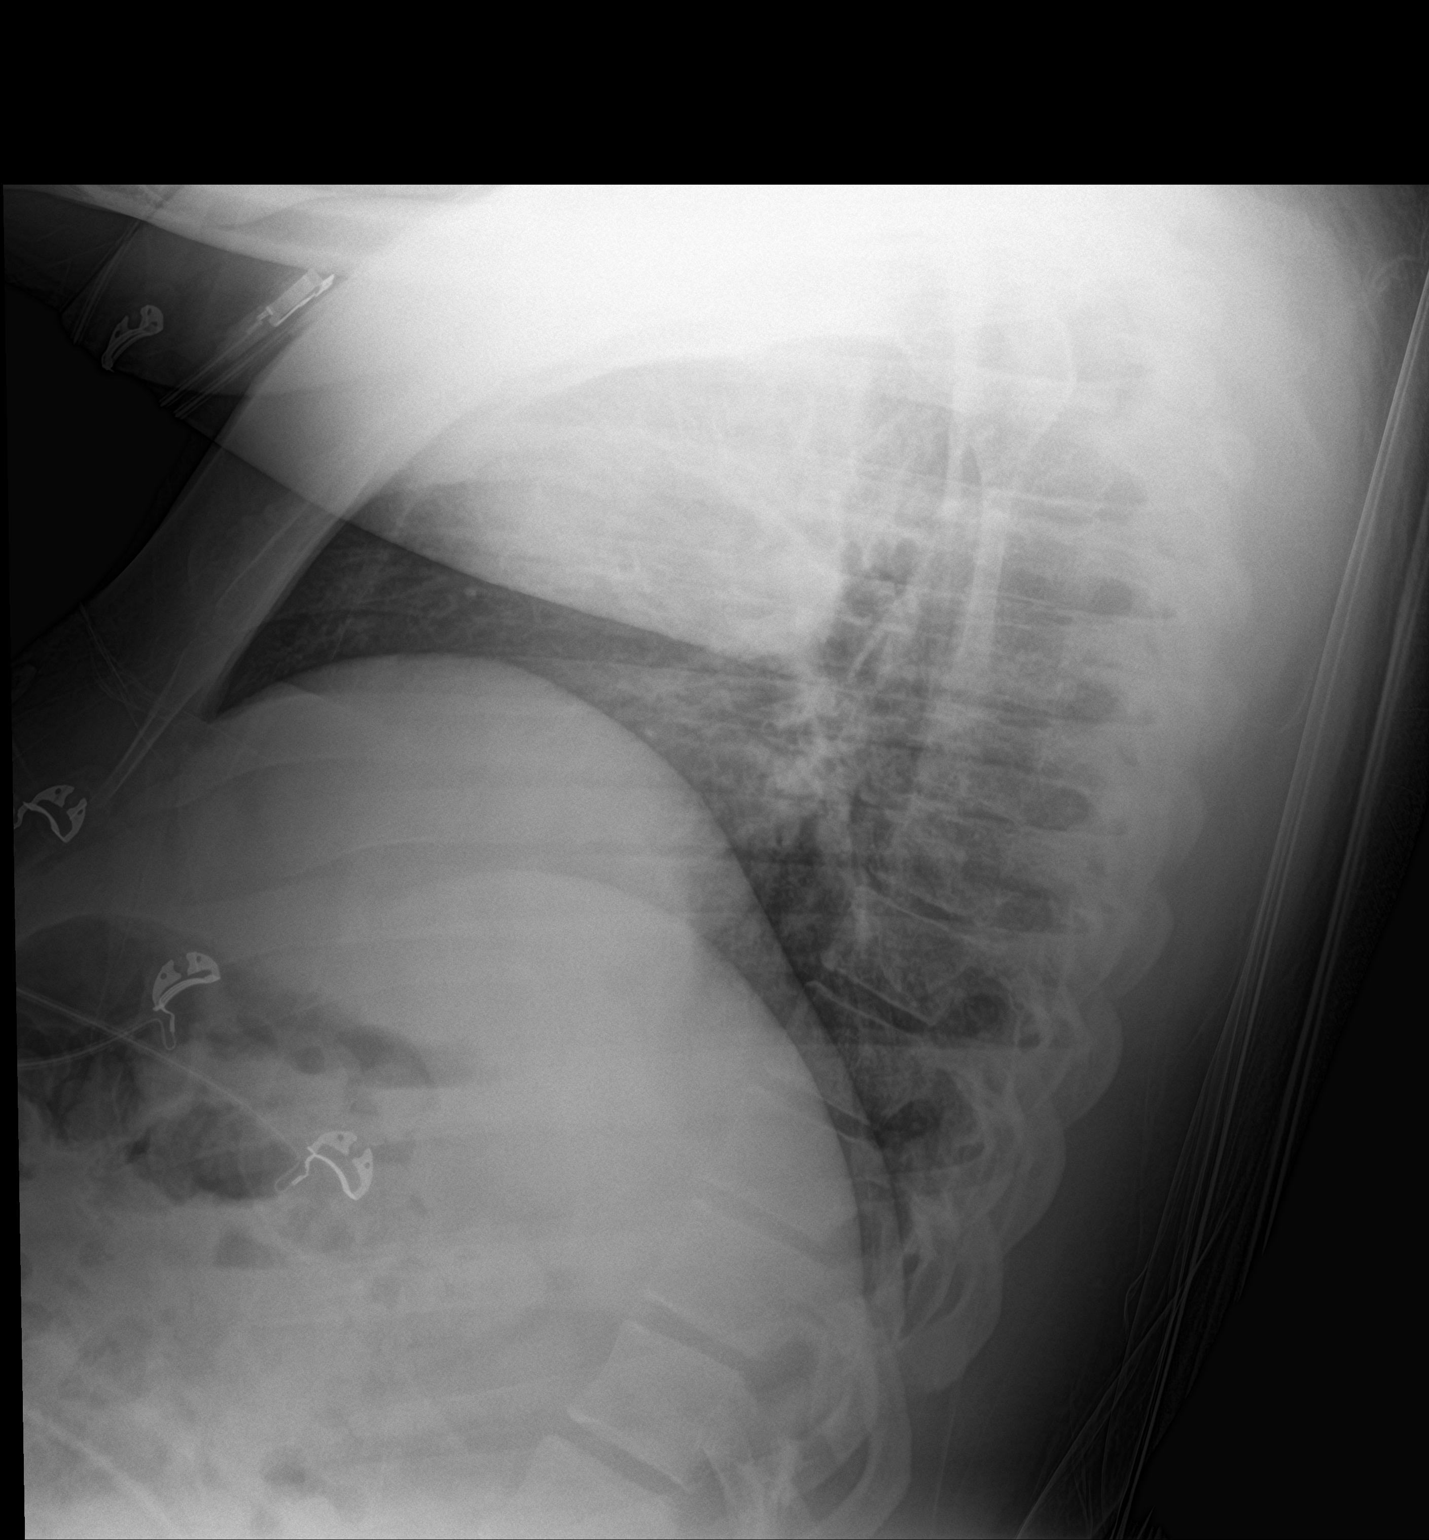

[chest ap]
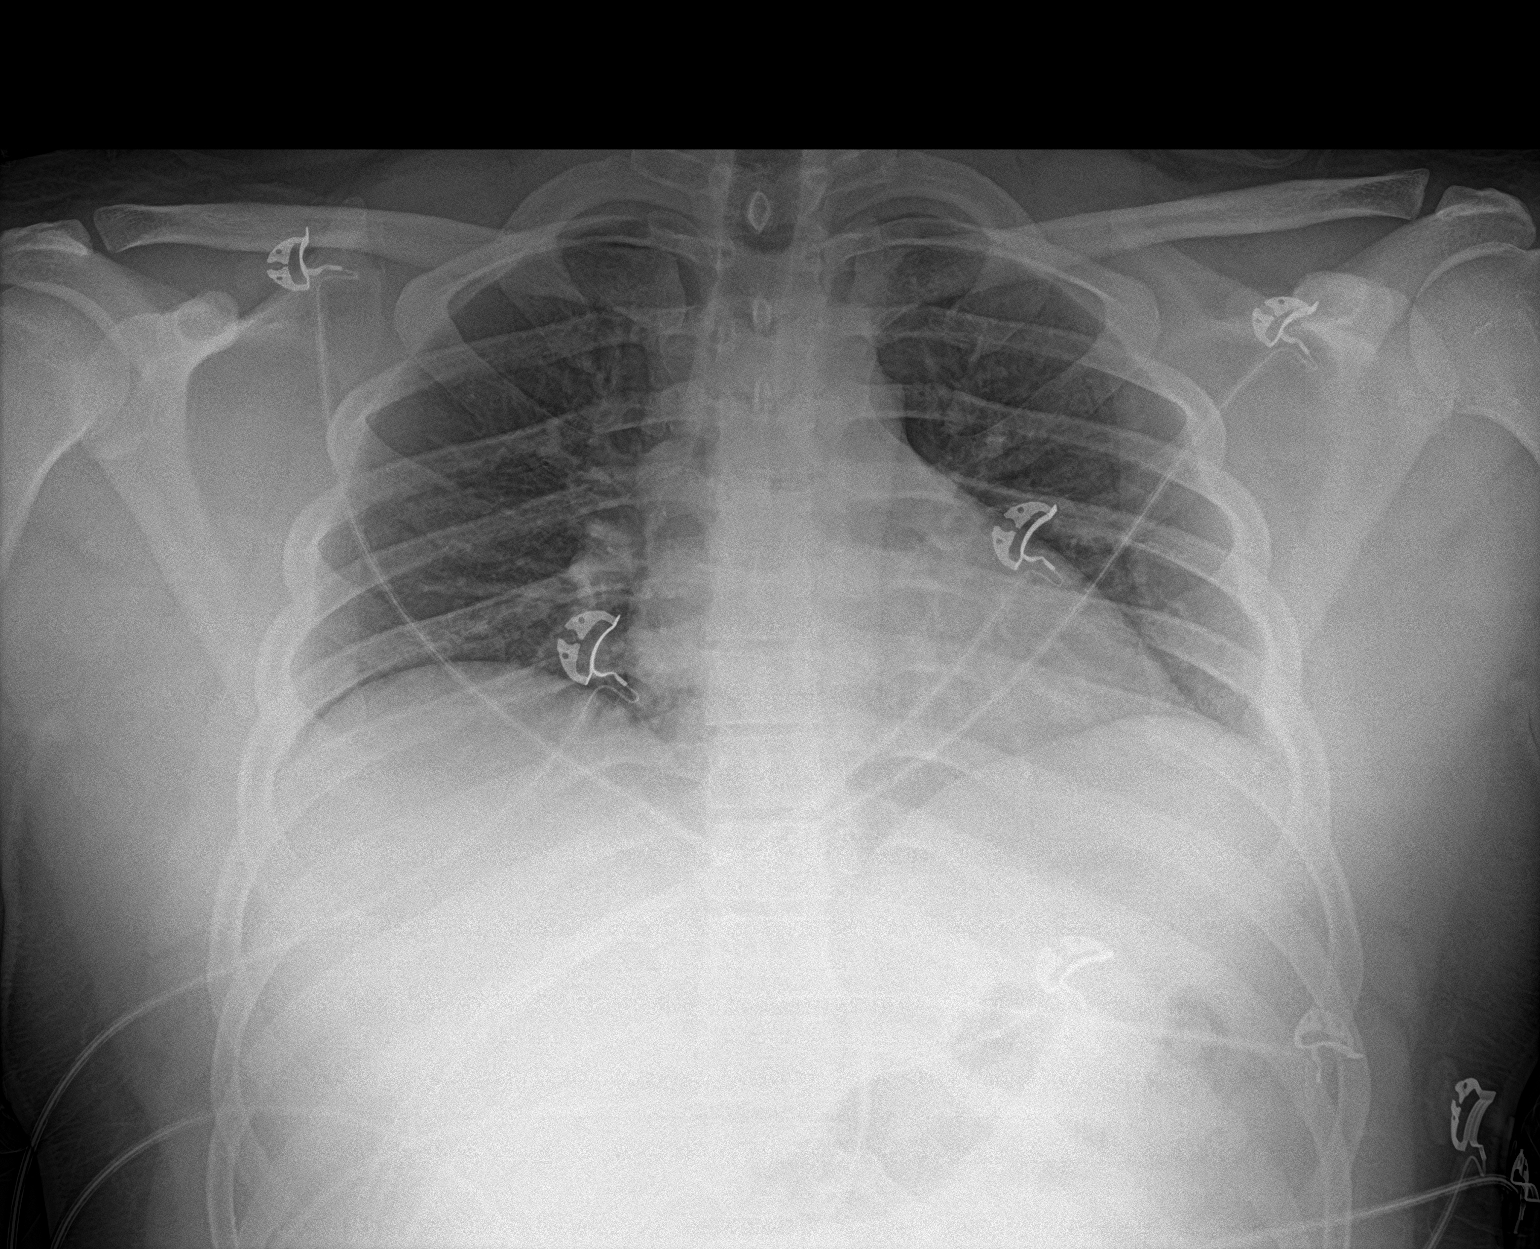

[2 of 2 positions shown; findings below may reference images not displayed]

FINDINGS: Degree of inspiration is shallow. Lungs are clear. Heart size and
pulmonary vascularity are normal. No adenopathy. No bone lesions.
IMPRESSION: No edema or consolidation.  Shallow degree of inspiration.

## 2018-11-27 ENCOUNTER — Ambulatory Visit (INDEPENDENT_AMBULATORY_CARE_PROVIDER_SITE_OTHER): Payer: 59 | Admitting: Family Medicine

## 2018-11-27 ENCOUNTER — Other Ambulatory Visit: Payer: Self-pay

## 2018-11-27 ENCOUNTER — Other Ambulatory Visit (HOSPITAL_COMMUNITY)
Admission: RE | Admit: 2018-11-27 | Discharge: 2018-11-27 | Disposition: A | Discharge status: Home / Self Care | Payer: 59 | Source: Ambulatory Visit | Attending: Family Medicine | Admitting: Family Medicine

## 2018-11-27 ENCOUNTER — Encounter: Payer: Self-pay | Admitting: Family Medicine

## 2018-11-27 VITALS — BP 122/86 | HR 62 | Temp 96.9°F | Resp 14 | Ht 66.0 in | Wt 231.2 lb

## 2018-11-27 DIAGNOSIS — Z6838 Body mass index (BMI) 38.0-38.9, adult: Secondary | ICD-10-CM | POA: Insufficient documentation

## 2018-11-27 DIAGNOSIS — Z113 Encounter for screening for infections with a predominantly sexual mode of transmission: Secondary | ICD-10-CM | POA: Insufficient documentation

## 2018-11-27 DIAGNOSIS — E6609 Other obesity due to excess calories: Secondary | ICD-10-CM | POA: Diagnosis not present

## 2018-11-27 DIAGNOSIS — Z6837 Body mass index (BMI) 37.0-37.9, adult: Secondary | ICD-10-CM

## 2018-11-27 DIAGNOSIS — J4541 Moderate persistent asthma with (acute) exacerbation: Secondary | ICD-10-CM | POA: Diagnosis not present

## 2018-11-27 DIAGNOSIS — Z131 Encounter for screening for diabetes mellitus: Secondary | ICD-10-CM

## 2018-11-27 DIAGNOSIS — E669 Obesity, unspecified: Secondary | ICD-10-CM | POA: Insufficient documentation

## 2018-11-27 DIAGNOSIS — Z1159 Encounter for screening for other viral diseases: Secondary | ICD-10-CM

## 2018-11-27 DIAGNOSIS — Z13220 Encounter for screening for lipoid disorders: Secondary | ICD-10-CM

## 2018-11-27 MED ORDER — BUDESONIDE-FORMOTEROL FUMARATE 80-4.5 MCG/ACT IN AERO: 2.0000 | INHALATION_SPRAY | Freq: Two times a day (BID) | RESPIRATORY_TRACT | 3 refills | Status: DC

## 2018-11-27 MED ORDER — IPRATROPIUM-ALBUTEROL 0.5-2.5 (3) MG/3ML IN SOLN: 3.0000 mL | RESPIRATORY_TRACT | 3 refills | Status: DC | PRN

## 2018-11-27 MED ORDER — LEVOCETIRIZINE DIHYDROCHLORIDE 5 MG PO TABS: 5.0000 mg | ORAL_TABLET | Freq: Every evening | ORAL | 1 refills | Status: DC

## 2018-11-27 MED ORDER — PREDNISONE 10 MG (21) PO TBPK: ORAL_TABLET | ORAL | 0 refills | Status: DC

## 2018-11-27 MED ORDER — ALBUTEROL SULFATE HFA 108 (90 BASE) MCG/ACT IN AERS: 2.0000 | INHALATION_SPRAY | Freq: Four times a day (QID) | RESPIRATORY_TRACT | 3 refills | Status: DC | PRN

## 2018-11-27 NOTE — Progress Notes (Signed)
Name: Darren Kim   MRN: [id number]    DOB: 01-20-1991   Date:11/27/2018       Progress Note  Subjective  Chief Complaint  Chief Complaint  Patient presents with  . New Patient (Initial Visit)  . Asthma    sob everyday    HPI  PT presents to establish care and to discuss asthma:  He has had asthma his entire life, but after getting sick this winter, he has really been struggling to gain control of his shortness of breath, coughing, and chest tightness.  He has albuterol inhaler that works somewhat, he has duonebs (recently ran out) that seem to offer some temporary relief.  He barely sleeps at night because he is up at night coughing. He is not able to exercise. He is not taking zyrtec or flonase nasal spray despite having some nasal congestion.  +PHQ-9 Score: Mostly related to lack of sleep - states he does not feel depressed, wants to see how correcting his asthma helps this score.  Overeating/obesity: Over eating due to boredom.  He was working out, but stopped in March due to gym closures from COVID-19 pandemic. He did start walking recently, but his asthma really limits him right now.   STI Screening: 2 partners in the last year; has had testing in December and all was negative.  We will check again today.  There are no active problems to display for this patient.   No past surgical history on file.  No family history on file.  Social History   Socioeconomic History  . Marital status: Single    Spouse name: Not on file  . Number of children: Not on file  . Years of education: 7612  . Highest education level: Some college, no degree  Occupational History  . Occupation: self employeed  Social Needs  . Financial resource strain: Not hard at all  . Food insecurity    Worry: Never true    Inability: Never true  . Transportation needs    Medical: No    Non-medical: No  Tobacco Use  . Smoking status: Former Smoker    Types: Cigarettes    Quit date: 09/12/2016     Years since quitting: 2.2  . Smokeless tobacco: Never Used  Substance and Sexual Activity  . Alcohol use: Not Currently  . Drug use: No  . Sexual activity: Yes  Lifestyle  . Physical activity    Days per week: 0 days    Minutes per session: 0 min  . Stress: Not at all  Relationships  . Social Musicianconnections    Talks on phone: Twice a week    Gets together: Twice a week    Attends religious service: Never    Active member of club or organization: No    Attends meetings of clubs or organizations: Never    Relationship status: Not on file  . Intimate partner violence    Fear of current or ex partner: No    Emotionally abused: No    Physically abused: No    Forced sexual activity: No  Other Topics Concern  . Not on file  Social History Narrative  . Not on file     Current Outpatient Medications:  .  albuterol (PROVENTIL HFA;VENTOLIN HFA) 108 (90 Base) MCG/ACT inhaler, Inhale 2 puffs into the lungs every 6 (six) hours as needed for wheezing or shortness of breath., Disp: 1 Inhaler, Rfl: 2 .  ipratropium-albuterol (DUONEB) 0.5-2.5 (3) MG/3ML SOLN, Take 3  mLs by nebulization every 4 (four) hours as needed., Disp: 360 mL, Rfl: 3 .  fluticasone (FLONASE) 50 MCG/ACT nasal spray, Place 2 sprays into both nostrils daily., Disp: , Rfl:   No Known Allergies  I personally reviewed active problem list, medication list, allergies, notes from last encounter, lab results with the patient/caregiver today.   ROS  Ten systems reviewed and is negative except as mentioned in HPI  Objective  Vitals:   11/27/18 0945  BP: 122/86  Pulse: 62  Resp: 14  Temp: (!) 96.9 F (36.1 C)  SpO2: 99%  Weight: 231 lb 3.2 oz (104.9 kg)  Height: 5\' 6"  (1.676 m)   Body mass index is 37.32 kg/m.  Physical Exam Constitutional: Patient appears well-developed and well-nourished. No distress.  HENT: Head: Normocephalic and atraumatic. Eyes: Conjunctivae and EOM are normal. No scleral icterus. Neck:  Normal range of motion. Neck supple. No JVD present. No thyromegaly present.  Cardiovascular: Normal rate, regular rhythm and normal heart sounds.  No murmur heard. No BLE edema. Pulmonary/Chest: Effort normal and breath sounds exhibit decreased air movement, inspiratory and expiratory wheezing. No respiratory distress. Musculoskeletal: Normal range of motion, no joint effusions. No gross deformities Neurological: Pt is alert and oriented to person, place, and time. No cranial nerve deficit. Coordination, balance, strength, speech and gait are normal.  Skin: Skin is warm and dry. No rash noted. No erythema.  Psychiatric: Patient has a normal mood and affect. behavior is normal. Judgment and thought content normal.  No results found for this or any previous visit (from the past 72 hour(s)).   PHQ2/9: Depression screen PHQ 2/9 11/27/2018  Decreased Interest 1  Down, Depressed, Hopeless 0  PHQ - 2 Score 1  Altered sleeping 3  Tired, decreased energy 2  Change in appetite 3  Feeling bad or failure about yourself  0  Trouble concentrating 0  Moving slowly or fidgety/restless 0  Suicidal thoughts 0  PHQ-9 Score 9  Difficult doing work/chores Not difficult at all   PHQ-2/9 Result is positive.    Fall Risk: Fall Risk  11/27/2018  Falls in the past year? 0  Number falls in past yr: 0  Injury with Fall? 0    Functional Status Survey: Is the patient deaf or have difficulty hearing?: No Does the patient have difficulty seeing, even when wearing glasses/contacts?: No Does the patient have difficulty concentrating, remembering, or making decisions?: No Does the patient have difficulty walking or climbing stairs?: No Does the patient have difficulty dressing or bathing?: No Does the patient have difficulty doing errands alone such as visiting a doctor's office or shopping?: No   Assessment & Plan  1. Moderate persistent asthma with (acute) exacerbation - ipratropium-albuterol (DUONEB)  0.5-2.5 (3) MG/3ML SOLN; Take 3 mLs by nebulization every 4 (four) hours as needed.  Dispense: 360 mL; Refill: 3 - albuterol (VENTOLIN HFA) 108 (90 Base) MCG/ACT inhaler; Inhale 2 puffs into the lungs every 6 (six) hours as needed for wheezing or shortness of breath.  Dispense: 18 g; Refill: 3 - budesonide-formoterol (SYMBICORT) 80-4.5 MCG/ACT inhaler; Inhale 2 puffs into the lungs 2 (two) times daily.  Dispense: 1 Inhaler; Refill: 3 - levocetirizine (XYZAL) 5 MG tablet; Take 1 tablet (5 mg total) by mouth every evening.  Dispense: 90 tablet; Refill: 1 - predniSONE (STERAPRED UNI-PAK 21 TAB) 10 MG (21) TBPK tablet; Take as directed  Dispense: 21 tablet; Refill: 0  2. Class 2 obesity due to excess calories without serious comorbidity with  body mass index (BMI) of 37.0 to 37.9 in adult - Discussed importance of 150 minutes of physical activity weekly, eat two servings of fish weekly, eat one serving of tree nuts ( cashews, pistachios, pecans, almonds.Marland Kitchen.) every other day, eat 6 servings of fruit/vegetables daily and drink plenty of water and avoid sweet beverages.  - Lipid panel - COMPLETE METABOLIC PANEL WITH GFR  3. Diabetes mellitus screening - CMP per orders  4. Lipid screening - Lipid panel  5. Routine screening for STI (sexually transmitted infection) - RPR - HIV Antibody (routine testing w rflx) - Hepatitis C antibody - Urine cytology ancillary only  6. Need for hepatitis C screening test - Hepatitis C antibody

## 2018-11-28 ENCOUNTER — Telehealth: Payer: Self-pay | Admitting: Family Medicine

## 2018-11-28 LAB — LIPID PANEL
Cholesterol: 145 mg/dL (ref <200)
HDL: 42 mg/dL (ref >=40)
LDL Cholesterol (Calc): 85 mg/dL (calc)
Non-HDL Cholesterol (Calc): 103 mg/dL (calc) (ref <130)
Total CHOL/HDL Ratio: 3.5 (calc) (ref <5.0)
Triglycerides: 88 mg/dL (ref <150)

## 2018-11-28 LAB — COMPLETE METABOLIC PANEL WITH GFR
AG Ratio: 1.7 (calc) (ref 1.0–2.5)
ALT: 53 U/L — ABNORMAL HIGH (ref 9–46)
AST: 33 U/L (ref 10–40)
Albumin: 4.7 g/dL (ref 3.6–5.1)
Alkaline phosphatase (APISO): 42 U/L (ref 36–130)
BUN/Creatinine Ratio: 14 (calc) (ref 6–22)
BUN: 21 mg/dL (ref 7–25)
CO2: 18 mmol/L — ABNORMAL LOW (ref 20–32)
Calcium: 10.2 mg/dL (ref 8.6–10.3)
Chloride: 109 mmol/L (ref 98–110)
Creat: 1.53 mg/dL — ABNORMAL HIGH (ref 0.60–1.35)
GFR, Est African American: 72 mL/min/{1.73_m2} (ref >=60)
GFR, Est Non African American: 62 mL/min/{1.73_m2} (ref >=60)
Globulin: 2.7 g/dL (calc) (ref 1.9–3.7)
Glucose, Bld: 99 mg/dL (ref 65–99)
Potassium: 5.2 mmol/L (ref 3.5–5.3)
Sodium: 141 mmol/L (ref 135–146)
Total Bilirubin: 1.3 mg/dL — ABNORMAL HIGH (ref 0.2–1.2)
Total Protein: 7.4 g/dL (ref 6.1–8.1)

## 2018-11-28 LAB — HEPATITIS C ANTIBODY
Hepatitis C Ab: NONREACTIVE
SIGNAL TO CUT-OFF: 0.08 (ref <1.00)

## 2018-11-28 LAB — HIV ANTIBODY (ROUTINE TESTING W REFLEX): HIV 1&2 Ab, 4th Generation: NONREACTIVE

## 2018-11-28 LAB — RPR: RPR Ser Ql: NONREACTIVE

## 2018-11-28 NOTE — Telephone Encounter (Signed)
Pt called and stated that he would like a nurse to call him back to tell him how to take prednisone. Directions state" take as directed" please advise

## 2018-11-28 NOTE — Telephone Encounter (Signed)
Please see if he can use coupon card online for Symbicort to get it for free - his lungs were very tight and I think he'd benefit from it.  If not, call pharmacy to find out which alternatives are applicable.  Those are the correct instructions for the prednisone. Thanks!

## 2018-11-28 NOTE — Telephone Encounter (Signed)
I informed patient to start at 6 tablets today and decrease by 1 for next 6 days until gone. Patient stated he need a cheaper inhaler called in. The symbicort was $300.

## 2018-11-29 LAB — URINE CYTOLOGY ANCILLARY ONLY
Chlamydia: NEGATIVE
Neisseria Gonorrhea: NEGATIVE

## 2018-12-01 NOTE — Telephone Encounter (Signed)
Patient notified

## 2018-12-13 IMAGING — CR DG CHEST 2V
1 series · 2 of 2 positions shown · non-contrast
Comparison: 10/03/2016

CLINICAL DATA: Chest pain since [REDACTED], hot and cold feelings off
and on, history asthma, former smoker

EXAM:
CHEST  2 VIEW

[Series 1: dg chest 2 view · 0.14mm/px · 2 of 2 slices shown]
[im 1/2]
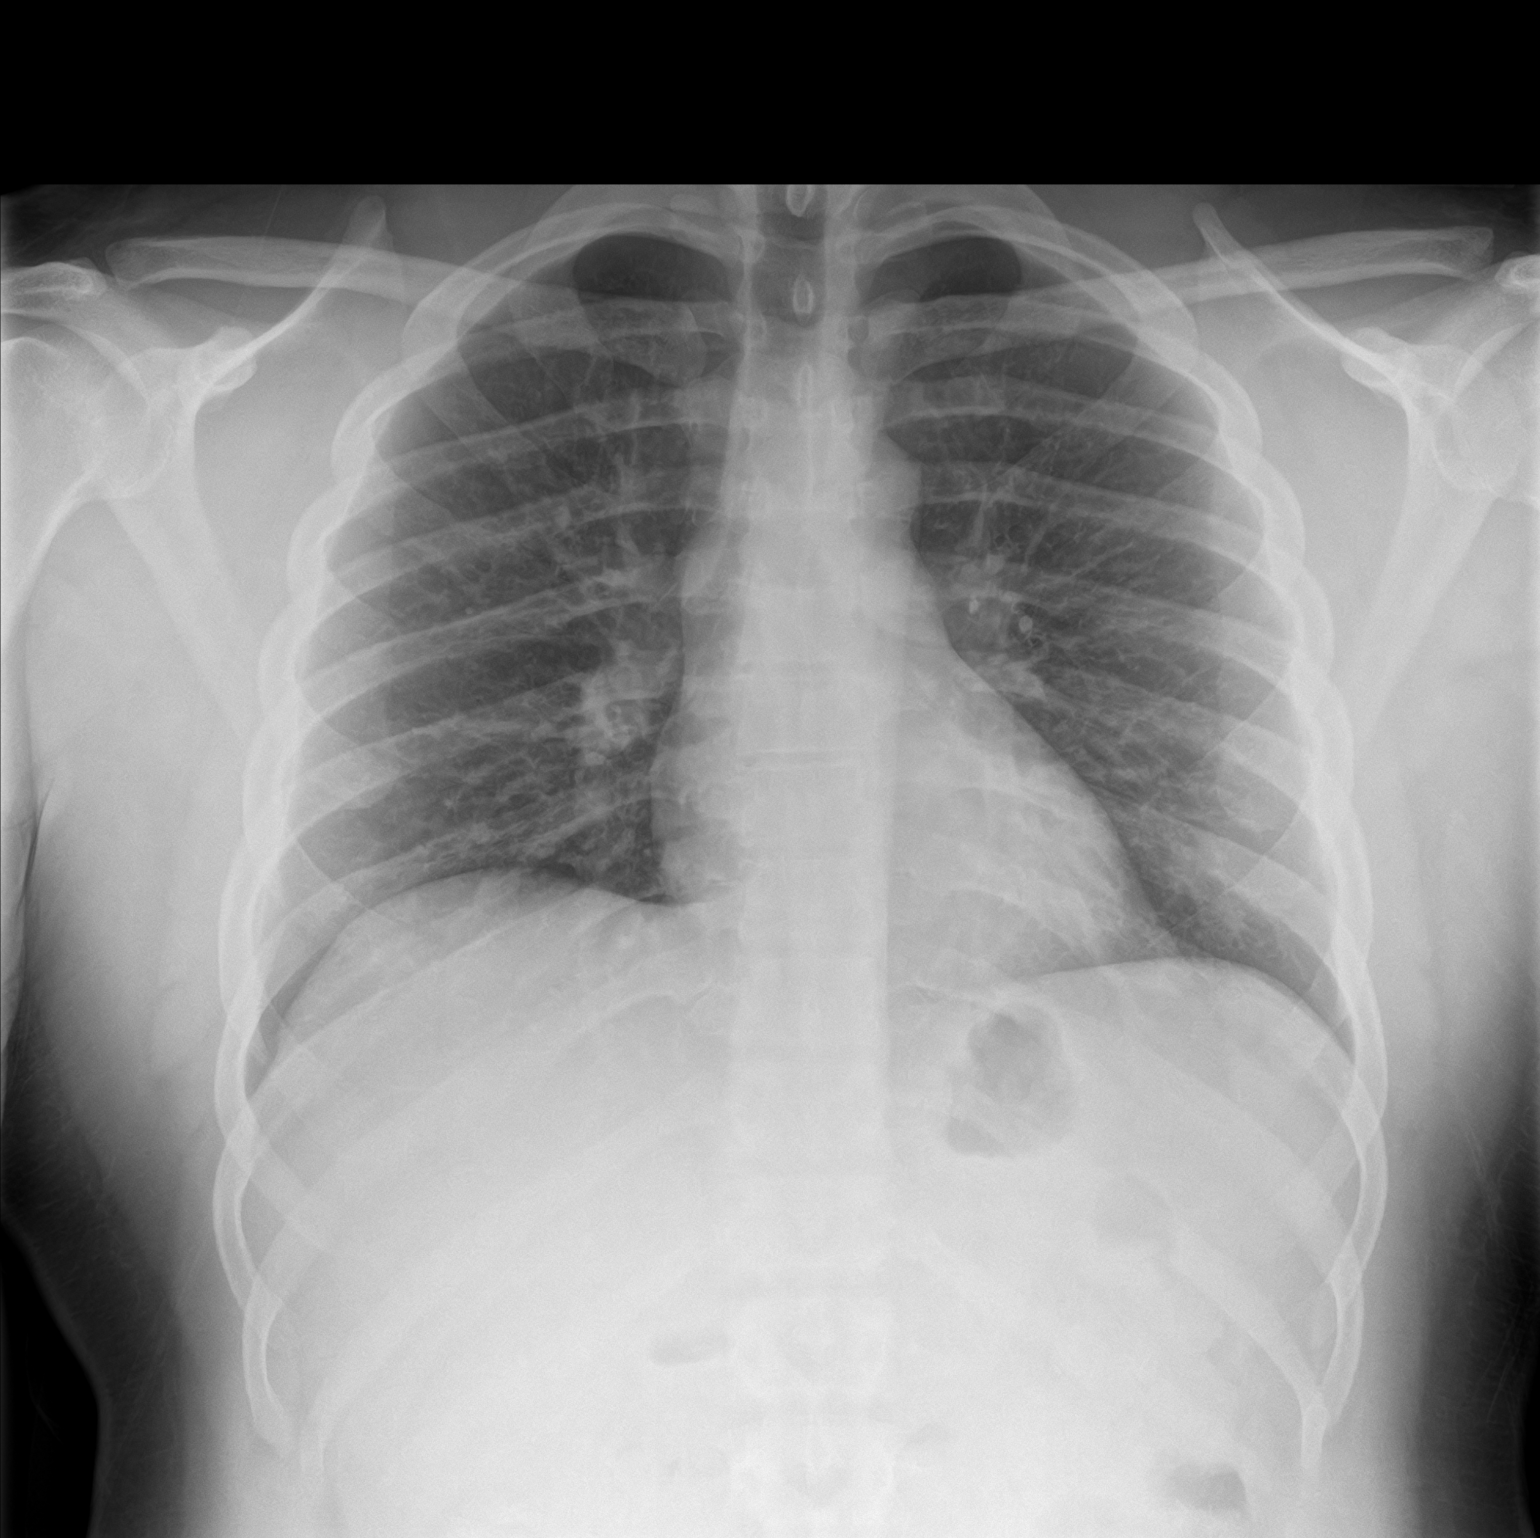
[im 2/2]
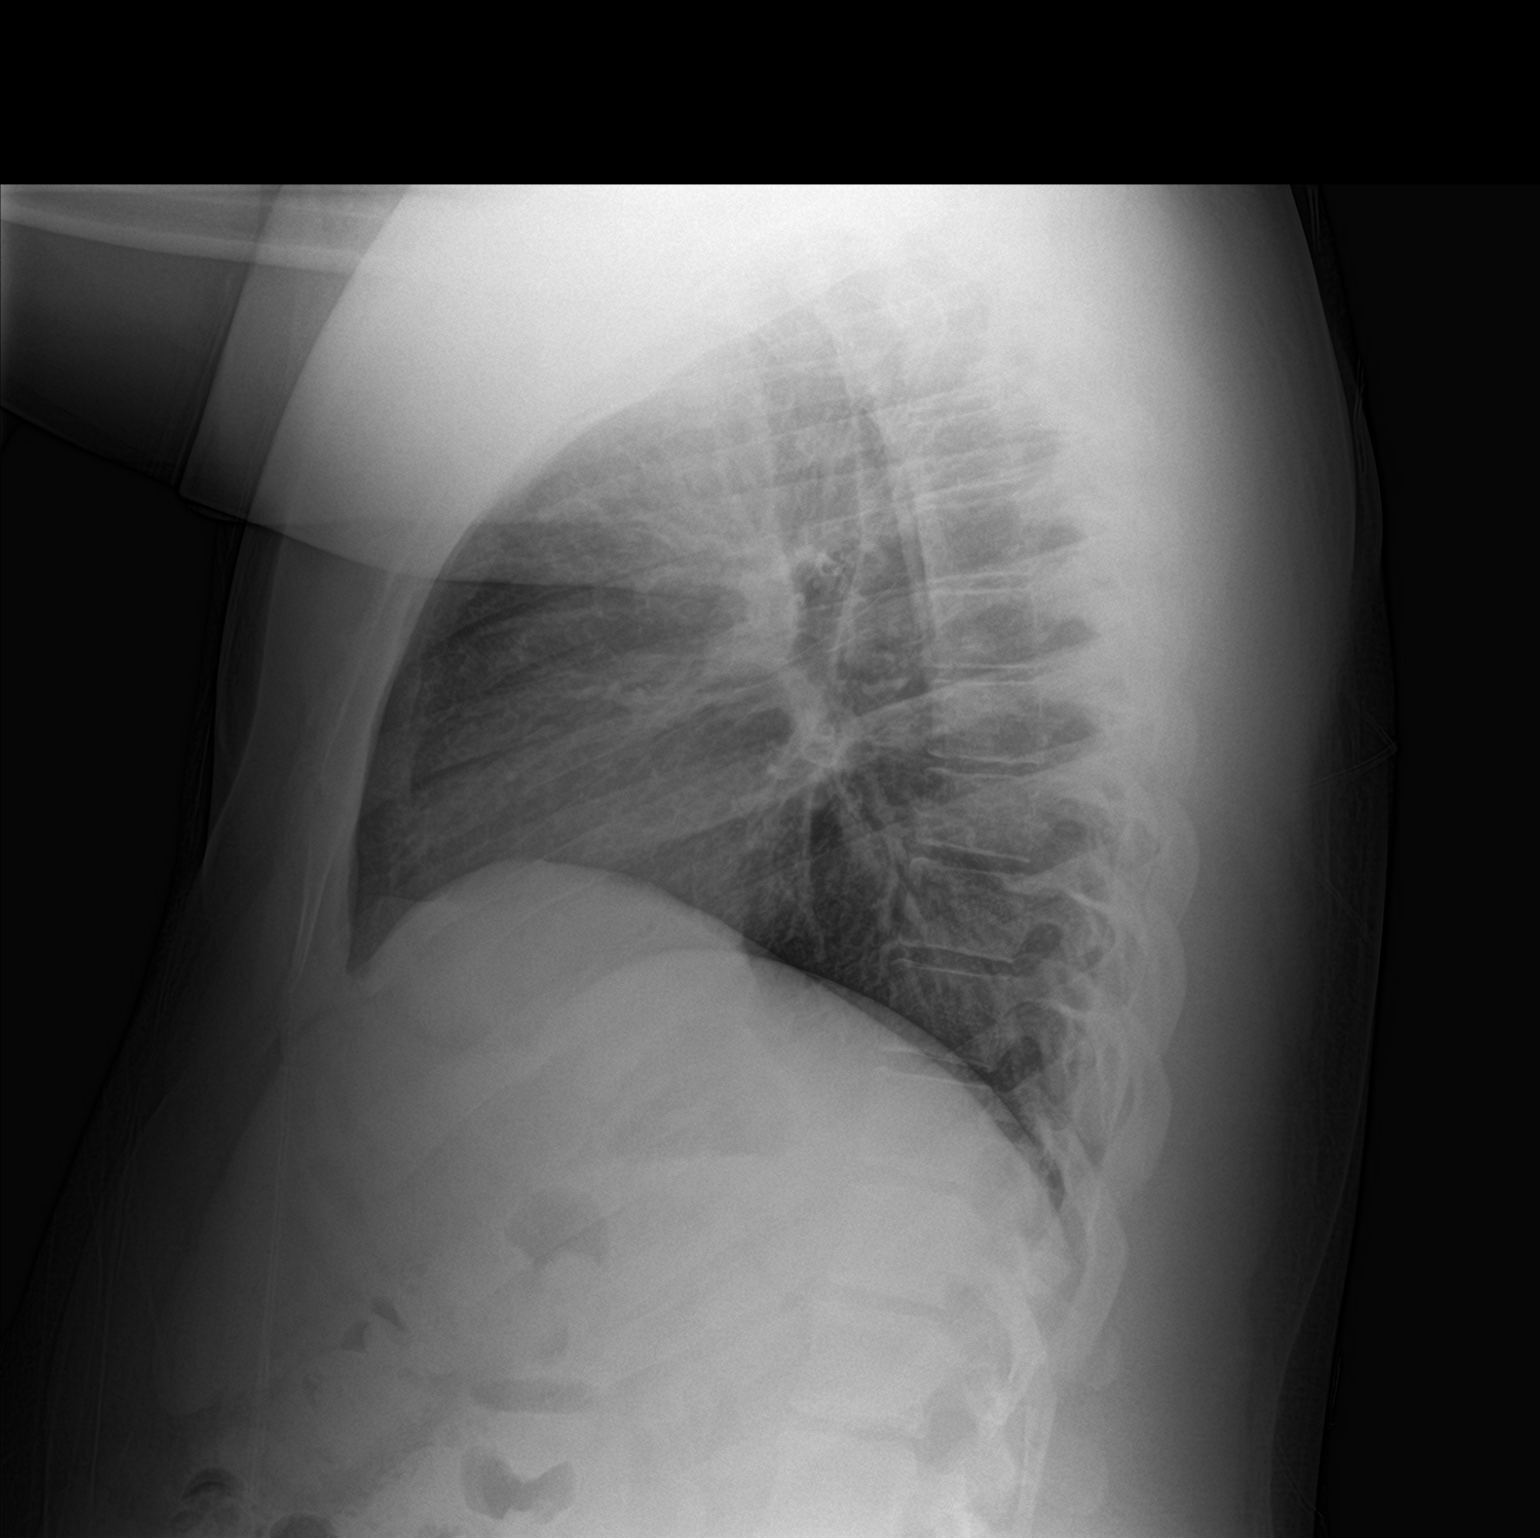

[2 of 2 positions shown; findings below may reference images not displayed]

FINDINGS: Normal heart size, mediastinal contours, and pulmonary vascularity.

Minimal peribronchial thickening.

Lungs clear.

No pleural effusion or pneumothorax.

Bones unremarkable.
IMPRESSION: Minimal peribronchial thickening which could reflect bronchitis or
patient's history of asthma.

No acute infiltrate.

## 2019-01-02 ENCOUNTER — Other Ambulatory Visit: Payer: Self-pay

## 2019-01-02 ENCOUNTER — Encounter: Payer: Self-pay | Admitting: Family Medicine

## 2019-01-02 ENCOUNTER — Ambulatory Visit (INDEPENDENT_AMBULATORY_CARE_PROVIDER_SITE_OTHER): Payer: 59 | Admitting: Family Medicine

## 2019-01-02 VITALS — BP 118/74 | HR 73 | Temp 97.5°F | Resp 16 | Ht 66.0 in | Wt 236.1 lb

## 2019-01-02 DIAGNOSIS — R252 Cramp and spasm: Secondary | ICD-10-CM

## 2019-01-02 DIAGNOSIS — J4541 Moderate persistent asthma with (acute) exacerbation: Secondary | ICD-10-CM | POA: Diagnosis not present

## 2019-01-02 DIAGNOSIS — Z0000 Encounter for general adult medical examination without abnormal findings: Secondary | ICD-10-CM | POA: Diagnosis not present

## 2019-01-02 DIAGNOSIS — R0981 Nasal congestion: Secondary | ICD-10-CM | POA: Diagnosis not present

## 2019-01-02 DIAGNOSIS — Z6837 Body mass index (BMI) 37.0-37.9, adult: Secondary | ICD-10-CM

## 2019-01-02 DIAGNOSIS — E6609 Other obesity due to excess calories: Secondary | ICD-10-CM | POA: Diagnosis not present

## 2019-01-02 DIAGNOSIS — F341 Dysthymic disorder: Secondary | ICD-10-CM

## 2019-01-02 MED ORDER — MONTELUKAST SODIUM 10 MG PO TABS: 10.0000 mg | ORAL_TABLET | Freq: Every day | ORAL | 1 refills | Status: DC

## 2019-01-02 MED ORDER — FLUTICASONE PROPIONATE 50 MCG/ACT NA SUSP: 2.0000 | Freq: Every day | NASAL | 6 refills | Status: DC

## 2019-01-02 MED ORDER — BUPROPION HCL ER (XL) 150 MG PO TB24: 150.0000 mg | ORAL_TABLET | Freq: Every day | ORAL | 1 refills | Status: DC

## 2019-01-02 NOTE — Progress Notes (Addendum)
Name: Darren Kim   MRN: [id number]    DOB: Aug 24, 1990   Date:01/02/2019       Progress Note  Subjective  Chief Complaint  Chief Complaint  Patient presents with  . Annual Exam  . Asthma    follow up  . Insomnia    HPI  Patient presents for annual CPE and brief follow up.  Asthma and Allergies: He has had asthma his entire life, but after getting sick this winter, he has really been struggling to gain control of his shortness of breath, coughing, and chest tightness.  He has albuterol inhaler that works for immediate relief, he has duonebs that he uses only PRN.  Started Symbicort at last visit and this seems to be helping. We will start Singulair, but he has not improved much since last visit and would like to see pulmonology - will place referral today.  +PHQ-9 Score: Mostly related to lack of sleep - states he does not feel depressed, but has low energy and tends to overeat.  No history of seizures.  Former marijuana smoker.  We discussed Wellbutrin as an option to help with low motivation, weight loss/overeating and to prevent cravings for smoking.   Overeating/obesity: Over eating due to boredom.  He was working out, but stopped in March due to gym closures from COVID-19 pandemic. He did start walking recently, but his asthma really limits him right now.    Bilateral Hand Cramping: Reports intermittent for years now, mother has similar issue and sees hand specialty.  He has to push his fingers from a flexed position into an extended position by pressing it against a wall.  USPSTF grade A and B recommendations:  Depression: phq 9 is positive [facility] 01/02/2019 11/27/2018  Decreased Interest 0 1  Down, Depressed, Hopeless 0 0  PHQ - 2 Score 0 1  Altered sleeping 3 3  Tired, decreased energy 0 2  Change in appetite 0 3  Feeling bad or failure about yourself  0 0  Trouble concentrating 0 0  Moving slowly or fidgety/restless 0 0  Suicidal thoughts 0 0   PHQ-9 Score 3 9  Difficult doing work/chores Not difficult at all Not difficult at all   Hypertension:  BP Readings from Last 3 Encounters:  01/02/19 118/74  11/27/18 122/86  05/10/17 128/80   Obesity: Wt Readings from Last 3 Encounters:  01/02/19 236 lb 1.6 oz (107.1 kg)  11/27/18 231 lb 3.2 oz (104.9 kg)  05/10/17 227 lb (103 kg)   BMI Readings from Last 3 Encounters:  01/02/19 38.11 kg/m  11/27/18 37.32 kg/m  05/10/17 36.64 kg/m     Lipids:  Lab Results  Component Value Date   CHOL 145 11/27/2018   Lab Results  Component Value Date   HDL 42 11/27/2018   Lab Results  Component Value Date   LDLCALC 85 11/27/2018   Lab Results  Component Value Date   TRIG 88 11/27/2018   Lab Results  Component Value Date   CHOLHDL 3.5 11/27/2018   No results found for: LDLDIRECT Glucose:  Glucose  Date Value Ref Range Status  08/27/2012 93 65 - 99 mg/dL Final  30/86/578412/27/2013 696105 (H) 65 - 99 mg/dL Final   Glucose, Bld  Date Value Ref Range Status  11/27/2018 99 65 - 99 mg/dL Final    Comment:    .            Fasting reference interval .   10/03/2016 93 65 -  99 mg/dL Final   Glucose-Capillary  Date Value Ref Range Status  05/10/2017 131 (H) 65 - 99 mg/dL Final      [facility] Visit from 01/02/2019 in [facility]  AUDIT-C Score  0    - Not drinking alochol  Single STD testing and prevention (HIV/chl/gon/syphilis): Negative in July Hep C: Negative in July  Skin cancer: No concerning lesions Colorectal cancer: Denies family or personal history of colorectal cancer, no changes in BM's - no blood in stool, dark and tarry stool, mucus in stool, or diarrhea.  Has BM 4-5 times a week.  Prostate/Testicular cancer: No family history.  Denies urinary symptoms except for some nocturia - is drinking a detox water and drinking a lot water right before bed.   Lung cancer:  Former marijuana smoker. No cigarette smoking or vaping.  AAA: N/A The USPSTF  recommends one-time screening with ultrasonography in men ages 22 to 36 years who have ever smoked ECG:  On file; having some shortness of breath with his asthma.  No chest pain or palpitations.   Advanced Care Planning: A voluntary discussion about advance care planning including the explanation and discussion of advance directives.  Discussed health care proxy and Living will, and the patient was able to identify a health care proxy as his parents.  Patient does not have a living will at present time. If patient does have living will, I have requested they bring this to the clinic to be scanned in to their chart.  Patient Active Problem List   Diagnosis Date Noted  . Moderate persistent asthma with (acute) exacerbation 11/27/2018  . Class 2 obesity due to excess calories without serious comorbidity with body mass index (BMI) of 37.0 to 37.9 in adult 11/27/2018    No past surgical history on file.  No family history on file.  Social History   Socioeconomic History  . Marital status: Single    Spouse name: Not on file  . Number of children: Not on file  . Years of education: 55  . Highest education level: Some college, no degree  Occupational History  . Occupation: self employeed  Social Needs  . Financial resource strain: Not hard at all  . Food insecurity    Worry: Never true    Inability: Never true  . Transportation needs    Medical: No    Non-medical: No  Tobacco Use  . Smoking status: Former Smoker    Types: Cigarettes    Quit date: 09/12/2016    Years since quitting: 2.3  . Smokeless tobacco: Never Used  Substance and Sexual Activity  . Alcohol use: Not Currently  . Drug use: Yes    Types: Marijuana  . Sexual activity: Yes    Partners: Female    Birth control/protection: Condom  Lifestyle  . Physical activity    Days per week: 0 days    Minutes per session: 0 min  . Stress: Not at all  Relationships  . Social Herbalist on phone: More than three  times a week    Gets together: Never    Attends religious service: Never    Active member of club or organization: No    Attends meetings of clubs or organizations: Never    Relationship status: Never married  . Intimate partner violence    Fear of current or ex partner: No    Emotionally abused: No    Physically abused: No    Forced sexual activity:  No  Other Topics Concern  . Not on file  Social History Narrative  . Not on file     Current Outpatient Medications:  .  albuterol (VENTOLIN HFA) 108 (90 Base) MCG/ACT inhaler, Inhale 2 puffs into the lungs every 6 (six) hours as needed for wheezing or shortness of breath., Disp: 18 g, Rfl: 3 .  budesonide-formoterol (SYMBICORT) 80-4.5 MCG/ACT inhaler, Inhale 2 puffs into the lungs 2 (two) times daily., Disp: 1 Inhaler, Rfl: 3 .  ipratropium-albuterol (DUONEB) 0.5-2.5 (3) MG/3ML SOLN, Take 3 mLs by nebulization every 4 (four) hours as needed., Disp: 360 mL, Rfl: 3 .  levocetirizine (XYZAL) 5 MG tablet, Take 1 tablet (5 mg total) by mouth every evening., Disp: 90 tablet, Rfl: 1 .  fluticasone (FLONASE) 50 MCG/ACT nasal spray, Place 2 sprays into both nostrils daily., Disp: , Rfl:  .  predniSONE (STERAPRED UNI-PAK 21 TAB) 10 MG (21) TBPK tablet, Take as directed (Patient not taking: Reported on 01/02/2019), Disp: 21 tablet, Rfl: 0  No Known Allergies   ROS  Constitutional: Negative for fever or weight change.  Respiratory: Negative for cough; positive shortness of breath.   Cardiovascular: Negative for chest pain or palpitations.  Gastrointestinal: Negative for abdominal pain, no bowel changes.  Musculoskeletal: Negative for gait problem or joint swelling.  Skin: Negative for rash.  Neurological: Negative for dizziness or headache.  No other specific complaints in a complete review of systems (except as listed in HPI above).  Objective  Vitals:   01/02/19 0927  BP: 118/74  Pulse: 73  Resp: 16  Temp: (!) 97.5 F (36.4 C)   TempSrc: Oral  SpO2: 99%  Weight: 236 lb 1.6 oz (107.1 kg)  Height: 5\' 6"  (1.676 m)    Body mass index is 38.11 kg/m.  Physical Exam  Constitutional: Patient appears well-developed and well-nourished. No distress.  HENT: Head: Normocephalic and atraumatic. Ears: B TMs ok, no erythema or effusion; Nose: Nose normal. Mouth/Throat: Oropharynx is clear and moist. No oropharyngeal exudate.  Eyes: Conjunctivae and EOM are normal. Pupils are equal, round, and reactive to light. No scleral icterus.  Neck: Normal range of motion. Neck supple. No JVD present. No thyromegaly present.  Cardiovascular: Normal rate, regular rhythm and normal heart sounds.  No murmur heard. No BLE edema. Pulmonary/Chest: Effort normal and breath sounds normal. No respiratory distress. Abdominal: Soft. Bowel sounds are normal, no distension. There is no tenderness. no masses MALE GENITALIA: Deferred Musculoskeletal: Normal range of motion, no joint effusions. No gross deformities Neurological: he is alert and oriented to person, place, and time. No cranial nerve deficit. Coordination, balance, strength, speech and gait are normal.  Skin: Skin is warm and dry. No rash noted. No erythema.  Psychiatric: Patient has a normal mood and affect. behavior is normal. Judgment and thought content normal.  Recent Results (from the past 2160 hour(s))  Urine cytology ancillary only     Status: None   Collection Time: 11/27/18 12:00 AM  Result Value Ref Range   Chlamydia Negative     Comment: Normal Reference Range - Negative   Neisseria gonorrhea Negative     Comment: Normal Reference Range - Negative  Lipid panel     Status: None   Collection Time: 11/27/18 10:36 AM  Result Value Ref Range   Cholesterol 145 <200 mg/dL   HDL 42 > OR = 40 mg/dL   Triglycerides 88 <161<150 mg/dL   LDL Cholesterol (Calc) 85 mg/dL (calc)    Comment: Reference range: <  100 . Desirable range <100 mg/dL for primary prevention;   <70 mg/dL for  patients with CHD or diabetic patients  with > or = 2 CHD risk factors. Marland Kitchen LDL-C is now calculated using the Martin-Hopkins  calculation, which is a validated novel method providing  better accuracy than the Friedewald equation in the  estimation of LDL-C.  Horald Pollen et al. Lenox Ahr. 2902;111(55): 2061-2068  (http://education.[facility].com/faq/FAQ164)    Total CHOL/HDL Ratio 3.5 <5.0 (calc)   Non-HDL Cholesterol (Calc) 103 <130 mg/dL (calc)    Comment: For patients with diabetes plus 1 major ASCVD risk  factor, treating to a non-HDL-C goal of <100 mg/dL  (LDL-C of <20 mg/dL) is considered a therapeutic  option.   COMPLETE METABOLIC PANEL WITH GFR     Status: Abnormal   Collection Time: 11/27/18 10:36 AM  Result Value Ref Range   Glucose, Bld 99 65 - 99 mg/dL    Comment: .            Fasting reference interval .    BUN 21 7 - 25 mg/dL   Creat 8.02 (H) 2.33 - 1.35 mg/dL   GFR, Est Non African American 62 > OR = 60 mL/min/1.65m2   GFR, Est African American 72 > OR = 60 mL/min/1.31m2   BUN/Creatinine Ratio 14 6 - 22 (calc)   Sodium 141 135 - 146 mmol/L   Potassium 5.2 3.5 - 5.3 mmol/L   Chloride 109 98 - 110 mmol/L   CO2 18 (L) 20 - 32 mmol/L   Calcium 10.2 8.6 - 10.3 mg/dL   Total Protein 7.4 6.1 - 8.1 g/dL   Albumin 4.7 3.6 - 5.1 g/dL   Globulin 2.7 1.9 - 3.7 g/dL (calc)   AG Ratio 1.7 1.0 - 2.5 (calc)   Total Bilirubin 1.3 (H) 0.2 - 1.2 mg/dL   Alkaline phosphatase (APISO) 42 36 - 130 U/L   AST 33 10 - 40 U/L    Comment: Results slightly increased due to hemolysis. Marland Kitchen    ALT 53 (H) 9 - 46 U/L  RPR     Status: None   Collection Time: 11/27/18 10:36 AM  Result Value Ref Range   RPR Ser Ql NON-REACTIVE NON-REACTI  HIV Antibody (routine testing w rflx)     Status: None   Collection Time: 11/27/18 10:36 AM  Result Value Ref Range   HIV 1&2 Ab, 4th Generation NON-REACTIVE NON-REACTI    Comment: HIV-1 antigen and HIV-1/HIV-2 antibodies were not detected. There is no  laboratory evidence of HIV infection. Marland Kitchen PLEASE NOTE: This information has been disclosed to you from records whose confidentiality may be protected by state law.  If your state requires such protection, then the state law prohibits you from making any further disclosure of the information without the specific written consent of the person to whom it pertains, or as otherwise permitted by law. A general authorization for the release of medical or other information is NOT sufficient for this purpose. . For additional information please refer to http://education.[facility].com/faq/FAQ106 (This link is being provided for informational/ educational purposes only.) . Marland Kitchen The performance of this assay has not been clinically validated in patients less than 37 years old. .   Hepatitis C antibody     Status: None   Collection Time: 11/27/18 10:36 AM  Result Value Ref Range   Hepatitis C Ab NON-REACTIVE NON-REACTI   SIGNAL TO CUT-OFF 0.08 <1.00    Comment: . HCV antibody was non-reactive. There is no laboratory  evidence of HCV infection. . In most cases, no further action is required. However, if recent HCV exposure is suspected, a test for HCV RNA (test code 54098) is suggested. . For additional information please refer to http://education.[facility].com/faq/FAQ22v1 (This link is being provided for informational/ educational purposes only.) .      PHQ2/9: Depression screen [facility] 2/9 01/02/2019 11/27/2018  Decreased Interest 0 1  Down, Depressed, Hopeless 0 0  PHQ - 2 Score 0 1  Altered sleeping 3 3  Tired, decreased energy 0 2  Change in appetite 0 3  Feeling bad or failure about yourself  0 0  Trouble concentrating 0 0  Moving slowly or fidgety/restless 0 0  Suicidal thoughts 0 0  PHQ-9 Score 3 9  Difficult doing work/chores Not difficult at all Not difficult at all    Fall Risk: Fall Risk  01/02/2019 11/27/2018  Falls in the past year? 0 0  Number falls in  past yr: 0 0  Injury with Fall? 0 0  Follow up Falls evaluation completed -   Assessment & Plan  1. Annual physical exam -Prostate cancer screening and PSA options (with potential risks and benefits of testing vs not testing) were discussed along with recent recs/guidelines. -USPSTF grade A and B recommendations reviewed with patient; age-appropriate recommendations, preventive care, screening tests, etc discussed and encouraged; healthy living encouraged; see AVS for patient education given to patient -Discussed importance of 150 minutes of physical activity weekly, eat two servings of fish weekly, eat one serving of tree nuts ( cashews, pistachios, pecans, almonds.Marland Kitchen) every other day, eat 6 servings of fruit/vegetables daily and drink plenty of water and avoid sweet beverages.    2. Moderate persistent asthma with (acute) exacerbation - fluticasone (FLONASE) 50 MCG/ACT nasal spray; Place 2 sprays into both nostrils daily.  Dispense: 16 g; Refill: 6 - montelukast (SINGULAIR) 10 MG tablet; Take 1 tablet (10 mg total) by mouth at bedtime.  Dispense: 90 tablet; Refill: 1 - Ambulatory referral to Pulmonology  3. Class 2 obesity due to excess calories without serious comorbidity with body mass index (BMI) of 37.0 to 37.9 in adult - buPROPion (WELLBUTRIN XL) 150 MG 24 hr tablet; Take 1 tablet (150 mg total) by mouth daily.  Dispense: 90 tablet; Refill: 1  4. Nasal congestion - fluticasone (FLONASE) 50 MCG/ACT nasal spray; Place 2 sprays into both nostrils daily.  Dispense: 16 g; Refill: 6  5. Dysthymia - buPROPion (WELLBUTRIN XL) 150 MG 24 hr tablet; Take 1 tablet (150 mg total) by mouth daily.  Dispense: 90 tablet; Refill: 1

## 2019-01-02 NOTE — Addendum Note (Signed)
Addended by: Raelyn Ensign E on: 01/02/2019 10:30 AM   Modules accepted: Orders

## 2019-01-02 NOTE — Patient Instructions (Addendum)
Take Levocetirizine (Xyzal) at night, take Monteleukast (Singulair) at night. Use Symbicort inhaler twice daily every day.  Take albuterol inhaler as needed.   Melatonin  Sleep Hygiene Tips 1) Get regular. One of the best ways to train your body to sleep well is to go to bed and get up at more or less the same time every day, even on weekends and days off! This regular rhythm will make you feel better and will give your body something to work from. 2) Sleep when sleepy. Only try to sleep when you actually feel tired or sleepy, rather than spending too much time awake in bed. 3) Get up & try again. If you haven't been able to get to sleep after about 20 minutes or more, get up and do something calming or boring until you feel sleepy, then return to bed and try again. Sit quietly on the couch with the lights off (bright light will tell your brain that it is time to wake up), or read something boring like the phone book. Avoid doing anything that is too stimulating or interesting, as this will wake you up even more. 4) Avoid caffeine & nicotine. It is best to avoid consuming any caffeine (in coffee, tea, cola drinks, chocolate, and some medications) or nicotine (cigarettes) for at least 4-6 hours before going to bed. These substances act as stimulants and interfere with the ability to fall asleep 5) Avoid alcohol. It is also best to avoid alcohol for at least 4-6 hours before going to bed. Many people believe that alcohol is relaxing and helps them to get to sleep at first, but it actually interrupts the quality of sleep. 6) Bed is for sleeping. Try not to use your bed for anything other than sleeping and sex, so that your body comes to associate bed with sleep. If you use bed as a place to watch TV, eat, read, work on your laptop, pay bills, and other things, your body will not learn this Connection. 7) No naps. It is best to avoid taking naps during the day, to make sure that  you are tired at bedtime. If you can't make it through the day without a nap, make sure it is for less than an hour and before 3pm. 8) Sleep rituals. You can develop your own rituals of things to remind your body that it is time to sleep - some people find it useful to do relaxing stretches or breathing exercises for 15 minutes before bed each night, or sit calmly with a cup of caffeine-free tea. 9) Bathtime. Having a hot bath 1-2 hours before bedtime can be useful, as it will raise your body temperature, causing you to feel sleepy as your body temperature drops again. Research shows that sleepiness is associated with a drop in body temperature. 10) No clock-watching. Many people who struggle with sleep tend to watch the clock too much. Frequently checking the clock during the night can wake you up (especially if you turn on the light to read the time) and reinforces negative thoughts such as "Oh no, look how late it is, I'll never get to sleep" or "it's so early, I have only slept for 5 hours, this is terrible." 11) Use a sleep diary. This worksheet can be a useful way of making sure you have the right facts about your sleep, rather than making assumptions. Because a diary involves watching the clock (see point 10) it is a good idea to only use it for two weeks  to get an idea of what is going and then perhaps two months down the track to see how you are progressing. 12) Exercise. Regular exercise is a good idea to help with good sleep, but try not to do strenuous exercise in the 4 hours before bedtime. Morning walks are a great way to start the day feeling refreshed! 13) Eat right. A healthy, balanced diet will help you to sleep well, but timing is important. Some people find that a very empty stomach at bedtime is distracting, so it can be useful to have a light snack, but a heavy meal soon before bed can also interrupt sleep. Some people recommend a warm glass of milk, which  contains tryptophan, which acts as a natural sleep inducer. 14) The right space. It is very important that your bed and bedroom are quiet and comfortable for sleeping. A cooler room with enough blankets to stay warm is best, and make sure you have curtains or an eyemask to block out early morning light and earplugs if there is noise outside your room. 15) Keep daytime routine the same. Even if you have a bad night sleep and are tired it is important that you try to keep your daytime activities the same as you had planned. That is, don't avoid activities because you feel tired. This can reinforce the insomnia.    High-Fiber Diet Fiber, also called dietary fiber, is a type of carbohydrate that is found in fruits, vegetables, whole grains, and beans. A high-fiber diet can have many health benefits. Your health care provider may recommend a high-fiber diet to help:  Prevent constipation. Fiber can make your bowel movements more regular.  Lower your cholesterol.  Relieve the following conditions: ? Swelling of veins in the anus (hemorrhoids). ? Swelling and irritation (inflammation) of specific areas of the digestive tract (uncomplicated diverticulosis). ? A problem of the large intestine (colon) that sometimes causes pain and diarrhea (irritable bowel syndrome, IBS).  Prevent overeating as part of a weight-loss plan.  Prevent heart disease, type 2 diabetes, and certain cancers. What is my plan? The recommended daily fiber intake in grams (g) includes:  38 g for men age 27 or younger.  30 g for men over age 19.  25 g for women age 27 or younger.  21 g for women over age 22. You can get the recommended daily intake of dietary fiber by:  Eating a variety of fruits, vegetables, grains, and beans.  Taking a fiber supplement, if it is not possible to get enough fiber through your diet. What do I need to know about a high-fiber diet?  It is better to get fiber through food  sources rather than from fiber supplements. There is not a lot of research about how effective supplements are.  Always check the fiber content on the nutrition facts label of any prepackaged food. Look for foods that contain 5 g of fiber or more per serving.  Talk with a diet and nutrition specialist (dietitian) if you have questions about specific foods that are recommended or not recommended for your medical condition, especially if those foods are not listed below.  Gradually increase how much fiber you consume. If you increase your intake of dietary fiber too quickly, you may have bloating, cramping, or gas.  Drink plenty of water. Water helps you to digest fiber. What are tips for following this plan?  Eat a wide variety of high-fiber foods.  Make sure that half of the grains that you  eat each day are whole grains.  Eat breads and cereals that are made with whole-grain flour instead of refined flour or white flour.  Eat brown rice, bulgur wheat, or millet instead of white rice.  Start the day with a breakfast that is high in fiber, such as a cereal that contains 5 g of fiber or more per serving.  Use beans in place of meat in soups, salads, and pasta dishes.  Eat high-fiber snacks, such as berries, raw vegetables, nuts, and popcorn.  Choose whole fruits and vegetables instead of processed forms like juice or sauce. What foods can I eat?  Fruits Berries. Pears. Apples. Oranges. Avocado. Prunes and raisins. Dried figs. Vegetables Sweet potatoes. Spinach. Kale. Artichokes. Cabbage. Broccoli. Cauliflower. Green peas. Carrots. Squash. Grains Whole-grain breads. Multigrain cereal. Oats and oatmeal. Brown rice. Barley. Bulgur wheat. Millet. Quinoa. Bran muffins. Popcorn. Rye wafer crackers. Meats and other proteins Navy, kidney, and pinto beans. Soybeans. Split peas. Lentils. Nuts and seeds. Dairy Fiber-fortified yogurt. Beverages Fiber-fortified soy milk. Fiber-fortified orange  juice. Other foods Fiber bars. The items listed above may not be a complete list of recommended foods and beverages. Contact a dietitian for more options. What foods are not recommended? Fruits Fruit juice. Cooked, strained fruit. Vegetables Fried potatoes. Canned vegetables. Well-cooked vegetables. Grains White bread. Pasta made with refined flour. White rice. Meats and other proteins Fatty cuts of meat. Fried chicken or fried fish. Dairy Milk. Yogurt. Cream cheese. Sour cream. Fats and oils Butters. Beverages Soft drinks. Other foods Cakes and pastries. The items listed above may not be a complete list of foods and beverages to avoid. Contact a dietitian for more information. Summary  Fiber is a type of carbohydrate. It is found in fruits, vegetables, whole grains, and beans.  There are many health benefits of eating a high-fiber diet, such as preventing constipation, lowering blood cholesterol, helping with weight loss, and reducing your risk of heart disease, diabetes, and certain cancers.  Gradually increase your intake of fiber. Increasing too fast can result in cramping, bloating, and gas. Drink plenty of water while you increase your fiber.  The best sources of fiber include whole fruits and vegetables, whole grains, nuts, seeds, and beans. This information is not intended to replace advice given to you by your health care provider. Make sure you discuss any questions you have with your health care provider. Document Released: 04/16/2005 Document Revised: 02/18/2017 Document Reviewed: 02/18/2017 Elsevier Patient Education  2020 [employer]

## 2019-01-02 NOTE — Progress Notes (Signed)
[facility] [place] [facility] Consultation      Assessment and Plan:  Severe persistent asthma. - Patient is not really using Symbicort due to symptoms of fatigue when using it, using Ventolin about 5 times per day. - We will change Symbicort to Brio inhaler once daily, use Ventolin as needed. - We will start Singulair, though with this is already on his medication list, the patient has not yet started it. - Was previously smoking marijuana about once per week until a few weeks ago, this will be important to stop completely in order to allow his lungs to recover.  Dyspnea on exertion. - Likely multifactorial second to asthma as well as a recent weight gain/obesity. - Would benefit from weight loss, discussed increasing physical activity once his breathing has improved.  Excessive daytime sleepiness. - Symptoms and signs of obstructive sleep apnea. - We will send for sleep study, start on CPAP as indicated.  We discussed that weight loss may help with potential OSA.  Orders Placed This Encounter  Procedures   DG Chest 2 View   Home sleep test   Meds ordered this encounter  Medications   fluticasone furoate-vilanterol (BREO ELLIPTA) 200-25 MCG/INH AEPB    Sig: Inhale 1 puff into the lungs daily. Rinse mouth after use.    Dispense:  1 each    Refill:  5   montelukast (SINGULAIR) 10 MG tablet    Sig: Take 1 tablet (10 mg total) by mouth at bedtime.    Dispense:  90 tablet    Refill:  1   DISCONTD: albuterol (VENTOLIN HFA) 108 (90 Base) MCG/ACT inhaler    Sig: Inhale 2 puffs into the lungs every 6 (six) hours as needed for wheezing or shortness of breath.    Dispense:  18 g    Refill:  3   albuterol (VENTOLIN HFA) 108 (90 Base) MCG/ACT inhaler    Sig: Inhale 2 puffs into the lungs every 6 (six) hours as needed for wheezing or shortness of breath.    Dispense:  18 g    Refill:  3   Return in about 3 months (around 04/07/2019).     Date: 01/06/2019  MRN# [id number]  Darren Kim Sep 06, 1990    Darren Kim is a 28 y.o. old male seen in consultation for chief complaint of:    Chief Complaint  Patient presents with   pulmonary consult    per Maurice Small- hx of asthma. pt reports of sob with exertion, mild non prod cough occ and wheezing    HPI:  Darren Kim is a 28 y.o. old male  with a history of asthma and allergic rhinitis. He got sick in January with flu like illness in Jan of 2020, he was seen in the ED and then had been using a neb. He used to work out until [employer], he has not worked out anymore and has gained about 15 lbs.  He is currently using symbicort but finds that it makes him weak if he does not eat, so he is taking it 1 puff once per day. Not sure that it helps that much as least as much as ventolin.  He is using ventolin 5 times per day which helps.  He does not take an antihistamine or an antihistamine which are on his med list.  He has no pets, has rare reflux.  He was diagnosed with asthma at about the age of 59, which has been on and off during the years.  He feels that when he is active the asthma is not as bad.  He has smoked MJ in the past to help him sleep, he was doing it about twice per week, but stopped about 3 weeks ago.  His mother smoked.  Denies sinus drainage.  He has no documented allergies but finds that he has allergy symptoms with certain exposures.  He moved to a new apt in December.  No recent travel. He works in a Administrator, [employer].  No environment exposures.   He goes to bed at between 10 and 1 am, then he will be on social media, falls asleep between 2-3 am. Then he will wake multiple times. He gets out of bed 930am, when wakes in am feels tired. Girlfriend says that he snores and sometimes stops breathing in his sleep.  He has had sleep paralysis about once per month, used to get it more often. Denies cataplexy.  He takes a nap 3 times per week.   Chest X ray 02/28/18>> images personally viewed, mild  changes of chronic bronchitis, otherwise within normal limits. CBC 10/03/16>> AEC 100.    PMHX:   Past Medical History:  Diagnosis Date   Asthma    Surgical Hx:  None.  Family Hx:  Reviewed, non-contributory.  Social Hx:   Social History   Tobacco Use   Smoking status: Never Smoker   Smokeless tobacco: Never Used  Substance Use Topics   Alcohol use: Not Currently   Drug use: Yes    Types: Marijuana   Medication:    Current Outpatient Medications:    albuterol (VENTOLIN HFA) 108 (90 Base) MCG/ACT inhaler, Inhale 2 puffs into the lungs every 6 (six) hours as needed for wheezing or shortness of breath., Disp: 18 g, Rfl: 3   budesonide-formoterol (SYMBICORT) 80-4.5 MCG/ACT inhaler, Inhale 2 puffs into the lungs 2 (two) times daily., Disp: 1 Inhaler, Rfl: 3   buPROPion (WELLBUTRIN XL) 150 MG 24 hr tablet, Take 1 tablet (150 mg total) by mouth daily., Disp: 90 tablet, Rfl: 1   fluticasone (FLONASE) 50 MCG/ACT nasal spray, Place 2 sprays into both nostrils daily., Disp: 16 g, Rfl: 6   ipratropium-albuterol (DUONEB) 0.5-2.5 (3) MG/3ML SOLN, Take 3 mLs by nebulization every 4 (four) hours as needed., Disp: 360 mL, Rfl: 3   levocetirizine (XYZAL) 5 MG tablet, Take 1 tablet (5 mg total) by mouth every evening., Disp: 90 tablet, Rfl: 1   montelukast (SINGULAIR) 10 MG tablet, Take 1 tablet (10 mg total) by mouth at bedtime., Disp: 90 tablet, Rfl: 1   Allergies:  Patient has no known allergies.  Review of Systems: Gen:  Denies  fever, sweats, chills HEENT: Denies blurred vision, double vision. bleeds, sore throat Cvc:  No dizziness, chest pain. Resp:   Denies cough or sputum production, shortness of breath Gi: Denies swallowing difficulty, stomach pain. Gu:  Denies bladder incontinence, burning urine Ext:   No Joint pain, stiffness. Skin: No skin rash,  hives  Endoc:  No polyuria, polydipsia. Psych: No depression, insomnia. Other:  All other systems were reviewed with  the patient and were negative other that what is mentioned in the HPI.   Physical Examination:   VS: BP 124/72 (BP Location: Left Arm, Cuff Size: Normal)    Pulse 78    Temp 98 F (36.7 C) (Temporal)    Ht 5\' 6"  (1.676 m)    Wt 232 lb (105.2 kg)    SpO2 98%    BMI 37.45 kg/m  General Appearance: No distress  Neuro:without focal findings,  speech normal,  HEENT: PERRLA, EOM intact.   Pulmonary: normal breath sounds, No wheezing.  CardiovascularNormal S1,S2.  No m/r/g.   Abdomen: Benign, Soft, non-tender. Renal:  No costovertebral tenderness  GU:  No performed at this time. Endoc: No evident thyromegaly, no signs of acromegaly. Skin:   warm, no rashes, no ecchymosis  Extremities: normal, no cyanosis, clubbing.  Other findings:    LABORATORY PANEL:   CBC No results for input(s): WBC, HGB, HCT, PLT in the last 168 hours. ------------------------------------------------------------------------------------------------------------------  Chemistries  No results for input(s): NA, K, CL, CO2, GLUCOSE, BUN, CREATININE, CALCIUM, MG, AST, ALT, ALKPHOS, BILITOT in the last 168 hours.  Invalid input(s): GFRCGP ------------------------------------------------------------------------------------------------------------------  Cardiac Enzymes No results for input(s): TROPONINI in the last 168 hours. ------------------------------------------------------------  RADIOLOGY:  No results found.     Thank  you for the consultation and for [facility] to assist in the care of your patient. Our recommendations are noted above.  Please contact us if we can be of further service.   Wells Guileseep Asiel Chrostowski, M.D., F.C.C.P.  Board Certified in Internal Medicine, Pulmonary Medicine, Critical Care Medicine, and Sleep Medicine.   Pulmonary and Critical Care Office Number: [phone]   01/06/2019

## 2019-01-06 ENCOUNTER — Ambulatory Visit
Admission: RE | Admit: 2019-01-06 | Discharge: 2019-01-06 | Disposition: A | Discharge status: Home / Self Care | Payer: 59 | Source: Ambulatory Visit | Attending: Internal Medicine | Admitting: Internal Medicine

## 2019-01-06 ENCOUNTER — Encounter: Payer: Self-pay | Admitting: Internal Medicine

## 2019-01-06 ENCOUNTER — Ambulatory Visit (INDEPENDENT_AMBULATORY_CARE_PROVIDER_SITE_OTHER): Payer: 59 | Admitting: Internal Medicine

## 2019-01-06 ENCOUNTER — Other Ambulatory Visit: Payer: Self-pay

## 2019-01-06 VITALS — BP 124/72 | HR 78 | Temp 98.0°F | Ht 66.0 in | Wt 232.0 lb

## 2019-01-06 DIAGNOSIS — J4541 Moderate persistent asthma with (acute) exacerbation: Secondary | ICD-10-CM

## 2019-01-06 DIAGNOSIS — J4550 Severe persistent asthma, uncomplicated: Secondary | ICD-10-CM

## 2019-01-06 MED ORDER — ALBUTEROL SULFATE HFA 108 (90 BASE) MCG/ACT IN AERS: 2.0000 | INHALATION_SPRAY | Freq: Four times a day (QID) | RESPIRATORY_TRACT | 3 refills | Status: DC | PRN

## 2019-01-06 MED ORDER — MONTELUKAST SODIUM 10 MG PO TABS: 10.0000 mg | ORAL_TABLET | Freq: Every day | ORAL | 1 refills | Status: DC

## 2019-01-06 MED ORDER — BREO ELLIPTA 200-25 MCG/INH IN AEPB: 1.0000 | INHALATION_SPRAY | Freq: Every day | RESPIRATORY_TRACT | 5 refills | Status: DC

## 2019-01-06 NOTE — Patient Instructions (Addendum)
Will restart singulair.  Will change symbicort to Breo once per day, rinse mouth after use.  Will check Chest x ray.  Will send for sleep study.    Sleep Apnea    Sleep apnea is disorder that affects a person's sleep. A person with sleep apnea has abnormal pauses in their breathing when they sleep. It is hard for them to get a good sleep. This makes a person tired during the day. It also can lead to other physical problems. There are three types of sleep apnea. One type is when breathing stops for a short time because your airway is blocked (obstructive sleep apnea). Another type is when the brain sometimes fails to give the normal signal to breathe to the muscles that control your breathing (central sleep apnea). The third type is a combination of the other two types.  HOME CARE   Take all medicine as told by your doctor.  Avoid alcohol, calming medicines (sedatives), and depressant drugs.  Try to lose weight if you are overweight. Talk to your doctor about a healthy weight goal.  Your doctor may have you use a device that helps to open your airway. It can help you get the air that you need. It is called a positive airway pressure (PAP) device.   MAKE SURE YOU:   Understand these instructions.  Will watch your condition.  Will get help right away if you are not doing well or get worse.  It may take approximately 1 month for you to get used to wearing her CPAP every night.  Be sure to work with your machine to get used to it, be patient, it may take time!  If you have trouble tolerating CPAP DO NOT RETURN YOUR MACHINE; Contact our office to see if we can help you tolerate the CPAP better first!

## 2019-02-03 ENCOUNTER — Other Ambulatory Visit: Payer: Self-pay | Admitting: Internal Medicine

## 2019-02-03 DIAGNOSIS — G4719 Other hypersomnia: Secondary | ICD-10-CM

## 2019-04-06 ENCOUNTER — Ambulatory Visit (INDEPENDENT_AMBULATORY_CARE_PROVIDER_SITE_OTHER): Payer: 59 | Admitting: Family Medicine

## 2019-04-06 ENCOUNTER — Other Ambulatory Visit: Payer: Self-pay

## 2019-04-06 ENCOUNTER — Encounter: Payer: Self-pay | Admitting: Family Medicine

## 2019-04-06 DIAGNOSIS — Z5320 Procedure and treatment not carried out because of patient's decision for unspecified reasons: Secondary | ICD-10-CM

## 2019-04-06 NOTE — Progress Notes (Signed)
Called patient to perform his visit.  He states several times that he is upset about what he has been billed for his recent appointments with myself and with pulmonology.  I offered to proceed with the visit and/or provide billing department number.  He chose to decline a visit today, billing department number to be provided to him.

## 2019-04-20 ENCOUNTER — Ambulatory Visit: Payer: 59 | Admitting: Acute Care

## 2019-05-25 NOTE — Progress Notes (Addendum)
Patient ID: Darren Kim, male    DOB: July 27, 1990, 29 y.o.   MRN: [id number]  PCP: Doren Custard, FNP  Chief Complaint  Patient presents with  . Asthma    Subjective:   Darren Kim is a 29 y.o. male, presents to clinic with CC of the following:  Chief Complaint  Patient presents with  . Asthma    HPI:  Last visit at [facility] was with Maurice Small in January 02, 2019, with that note reviewed. Was referred to pulmonary and was seen by them January 06, 2019. With that note reviewed.   Asthma and Allergies:  He has had asthma his entire life (diagnosed at age 38), and was noted to be struggling with symptoms on prior visits that included shortness of breath, coughing, and chest tightness. He had symbicort, duoneb, prn albuterol inhaler with singulair recommended to add on his last visit with Irving Burton, and pulmonary was also consulted as not felt to be improving. On pulmonary evaluation a few days later, a CXR was done and normal, and he was dx'ed with severe persistent asthma and the symbicort was changed to a Breo inhaler once daily, singulair was again rec'ed as he had not yet started, and albuterol inhaler to continue prn. Complete tob cessation recommended as well as he noted occas marijuana use then.   He was noted by pulmonary to not have documented allergies, but noted some allergy sx's with certain exposures. Was recommended flonase and antihistamines to use at some points in his past.Using flonase 2-3 tmes/week.. No EIA component was felt to exist (not worsened by activity, and actually seemed a little better when active)   Patient was noted to decline a more recent follow-up visit April 06, 2019 when contacted.  Medications currently taking - symbicort only once a day 3-4 times/week, singulair - ran out a week + ago, flonase, duoneb - uses 1-2X/day past week (was off for last week and thought may be doing better before worsened again this week), Albuterol inhaler prn,  often 3-4 times/day.  He notes that his symptoms in this past week have again been more persistent, and he has really not improved much at all after seeing pulmonology or his visit here last time (aside from a brief improvement last week briefly, but did not last).  He notes he often wheezes when symptomatic, not marked cough, no production, no fevers, no increased mucus or postnasal drip.  Does occasionally get short of breath with symptoms.  He notes he is not sleeping well, and that has not significantly changed.  He is a loud snorer.  He has no significant chest pains or lower extremity swelling.  He notes his symptoms seem to be worse when he is home than when he is at work at [employer], and out and about.  He relates no specific entities that seem to make his symptoms worse.  He does not have any pets. He has stopped all tobacco use, including no THC use in the recent past  Excessive daytime sleepiness.  Symptoms and signs of obstructive sleep apnea were also noted by pulmonary (GF noted he snores and sometimes stops breathing in his sleep and noted some daytime somnolence) and a home sleep study was ordered to evaluate OSA concern/contribution  Some sleep hygeine concerns also noted, with social media use noted at bedtime. Also noted that he often awakes in middle of night and wakes up in the morning not feeling rested.  The sleep study has  not yet been completed, as he noted it was a cost issue, and was too expensive for the sleep study.  Overeating/obesity: Given some more recent weight gain was noted on prior visits with Irving Burton and pulmonary, (and he noted some overeating due to boredom and stopping workouts due to gym closures from COVID-19 pandemic), increasing physical activity and trying to lose weight was also recommended as his breathing improved.   Wt Readings from Last 3 Encounters:  05/26/19 238 lb 9.6 oz (108.2 kg)  01/06/19 232 lb (105.2 kg)  01/02/19 236 lb 1.6 oz (107.1 kg)    05/10/2017 -      227 lbs  He notes he needs to lose weight, and has gained some since his last visit.  He has tried to be more active and going to the gym again over the last couple weeks with one time more recently he needed to return home to use his nebulizer with a flare while he was working out.  +PHQ-9 Score on last visit: Felt mostly related to lack of sleep - stated he did not feel depressed. Did note low energy and Wellbutrin was prescribed to help with low motivation, weight loss/overeating and to prevent potential cravings for smoking. Dysthymia was diagnosed.  Not taking the Wellbutrin as he noted he does not like to take medicines in general, and he did try the Wellbutrin and noted it did not help much, and that is why he stopped.  He notes at times he has some mild sadness, no crying spells, no SI or HI.  He again related is more apathetic than majorly depressed.  He is not anxious to add a medicine at this point.  I asked about the Covid vaccine interest, and he does not know if he will want to get that vaccine.  I also asked about the pneumonia vaccine, and told him it was indicated.   Patient Active Problem List   Diagnosis Date Noted  . Moderate persistent asthma with (acute) exacerbation 11/27/2018  . Class 2 obesity due to excess calories without serious comorbidity with body mass index (BMI) of 37.0 to 37.9 in adult 11/27/2018      Current Outpatient Medications:  .  albuterol (VENTOLIN HFA) 108 (90 Base) MCG/ACT inhaler, Inhale 2 puffs into the lungs every 6 (six) hours as needed for wheezing or shortness of breath., Disp: 18 g, Rfl: 3 .  budesonide-formoterol (SYMBICORT) 80-4.5 MCG/ACT inhaler, Inhale 2 puffs into the lungs 2 (two) times daily., Disp: 1 Inhaler, Rfl: 3 .  fluticasone (FLONASE) 50 MCG/ACT nasal spray, Place 2 sprays into both nostrils daily., Disp: 16 g, Rfl: 6 .  ipratropium-albuterol (DUONEB) 0.5-2.5 (3) MG/3ML SOLN, Take 3 mLs by nebulization every 4  (four) hours as needed., Disp: 360 mL, Rfl: 3 .  buPROPion (WELLBUTRIN XL) 150 MG 24 hr tablet, Take 1 tablet (150 mg total) by mouth daily. (Patient not taking: Reported on 04/06/2019), Disp: 90 tablet, Rfl: 1 .  montelukast (SINGULAIR) 10 MG tablet, Take 1 tablet (10 mg total) by mouth at bedtime. (Patient not taking: Reported on 04/06/2019), Disp: 90 tablet, Rfl: 1   No Known Allergies   History reviewed. No pertinent surgical history.   History reviewed. No pertinent family history.   Social History   Socioeconomic History  . Marital status: Single    Spouse name: Not on file  . Number of children: Not on file  . Years of education: 60  . Highest education level: Some college, no degree  Occupational History  . Occupation: self employeed  Tobacco Use  . Smoking status: Never Smoker  . Smokeless tobacco: Never Used  Substance and Sexual Activity  . Alcohol use: Not Currently  . Drug use: Yes    Types: Marijuana  . Sexual activity: Yes    Partners: Female    Birth control/protection: Condom  Other Topics Concern  . Not on file  Social History Narrative  . Not on file   Social Determinants of Health   Financial Resource Strain: Low Risk   . Difficulty of Paying Living Expenses: Not hard at all  Food Insecurity: No Food Insecurity  . Worried About Charity fundraiser in the Last Year: Never true  . Ran Out of Food in the Last Year: Never true  Transportation Needs: No Transportation Needs  . Lack of Transportation (Medical): No  . Lack of Transportation (Non-Medical): No  Physical Activity: Inactive  . Days of Exercise per Week: 0 days  . Minutes of Exercise per Session: 0 min  Stress: No Stress Concern Present  . Feeling of Stress : Not at all  Social Connections: Moderately Isolated  . Frequency of Communication with Friends and Family: More than three times a week  . Frequency of Social Gatherings with Friends and Family: Never  . Attends Religious Services:  Never  . Active Member of Clubs or Organizations: No  . Attends Archivist Meetings: Never  . Marital Status: Never married  Intimate Partner Violence: Not At Risk  . Fear of Current or Ex-Partner: No  . Emotionally Abused: No  . Physically Abused: No  . Sexually Abused: No    With staff assistance, above reviewed with the patient today.  ROS: As per HPI, otherwise no specific complaints on a limited and focused system review     PHQ2/9: Depression screen [facility] 2/9 05/26/2019 04/06/2019 01/02/2019 11/27/2018  Decreased Interest 2 1 0 1  Down, Depressed, Hopeless 0 1 0 0  PHQ - 2 Score 2 2 0 1  Altered sleeping 3 1 3 3   Tired, decreased energy 3 1 0 2  Change in appetite 2 2 0 3  Feeling bad or failure about yourself  0 2 0 0  Trouble concentrating 0 1 0 0  Moving slowly or fidgety/restless 0 1 0 0  Suicidal thoughts 0 0 0 0  PHQ-9 Score 10 10 3 9   Difficult doing work/chores Somewhat difficult Somewhat difficult Not difficult at all Not difficult at all   PHQ-2/9 Result is again mildly positive  Fall Risk: Fall Risk  05/26/2019 04/06/2019 01/02/2019 11/27/2018  Falls in the past year? 0 0 0 0  Number falls in past yr: 0 0 0 0  Injury with Fall? 0 0 0 0  Follow up - Falls evaluation completed Falls evaluation completed -      Objective:   Vitals:   05/26/19 1328  BP: 118/74  Pulse: 98  Resp: 20  Temp: (!) 97.1 F (36.2 C)  TempSrc: Temporal  SpO2: 95%  Weight: 238 lb 9.6 oz (108.2 kg)  Height: 5\' 6"  (1.676 m)    Body mass index is 38.51 kg/m.  Physical Exam   NAD, masked, obese HEENT - [place]/AT, sclera anicteric, PERRL, EOMI, conj - non-inj'ed, TM's and canals clear, pharynx clear Neck - supple, no adenopathy, no TM,  Car - RRR without m/g/r Pulm- RR and effort normal at rest, + end exp wheezes diffusely, right > left sided, no focal rales, air  movement was adequate Abd - soft, obese, NT, ND, BS+,  no masses Back - no CVA tenderness Skin- no rash noted, +  tattoos on UE's Ext - no LE edema,  Neuro/psychiatric - affect was not flat, appropriate with conversation  Alert and oriented  Grossly non-focal   Speech and gait are normal   Results for orders placed or performed in visit on 11/27/18  Lipid panel  Result Value Ref Range   Cholesterol 145 <200 mg/dL   HDL 42 > OR = 40 mg/dL   Triglycerides 88 <379 mg/dL   LDL Cholesterol (Calc) 85 mg/dL (calc)   Total CHOL/HDL Ratio 3.5 <5.0 (calc)   Non-HDL Cholesterol (Calc) 103 <130 mg/dL (calc)  COMPLETE METABOLIC PANEL WITH GFR  Result Value Ref Range   Glucose, Bld 99 65 - 99 mg/dL   BUN 21 7 - 25 mg/dL   Creat 0.24 (H) 0.97 - 1.35 mg/dL   GFR, Est Non African American 62 > OR = 60 mL/min/1.34m2   GFR, Est African American 72 > OR = 60 mL/min/1.35m2   BUN/Creatinine Ratio 14 6 - 22 (calc)   Sodium 141 135 - 146 mmol/L   Potassium 5.2 3.5 - 5.3 mmol/L   Chloride 109 98 - 110 mmol/L   CO2 18 (L) 20 - 32 mmol/L   Calcium 10.2 8.6 - 10.3 mg/dL   Total Protein 7.4 6.1 - 8.1 g/dL   Albumin 4.7 3.6 - 5.1 g/dL   Globulin 2.7 1.9 - 3.7 g/dL (calc)   AG Ratio 1.7 1.0 - 2.5 (calc)   Total Bilirubin 1.3 (H) 0.2 - 1.2 mg/dL   Alkaline phosphatase (APISO) 42 36 - 130 U/L   AST 33 10 - 40 U/L   ALT 53 (H) 9 - 46 U/L  RPR  Result Value Ref Range   RPR Ser Ql NON-REACTIVE NON-REACTI  HIV Antibody (routine testing w rflx)  Result Value Ref Range   HIV 1&2 Ab, 4th Generation NON-REACTIVE NON-REACTI  Hepatitis C antibody  Result Value Ref Range   Hepatitis C Ab NON-REACTIVE NON-REACTI   SIGNAL TO CUT-OFF 0.08 <1.00  Urine cytology ancillary only  Result Value Ref Range   Chlamydia Negative    Neisseria Gonorrhea Negative        Assessment & Plan:   1. Moderate persistent asthma with (acute) exacerbation  Currently not very well controlled, and not taking the steroid inhaler frequently.  He notes it does not seem to be as helpful for him as the albuterol inhaler.  Educated at length on  the importance of the steroid inhaler and how it will not have as much of a benefit acutely as the albuterol but is extremely important to helping him get better.  Felt best to add a Dosepak of the prednisone in the short-term, as he increases use of the steroid inhaler and emphasized the importance of taking the Symbicort 2 puffs twice daily about 12 hours apart.  Did mention the possibility of a Diskus type product, which may be more helpful in delivering the medicine to the needed area, and may add in the future pending his status.  Also refilled the Singulair product to take nightly and he thinks that may be a little helpful. Continue with all tobacco cessation as doing presently Noted limited presently not being able to get spirometry or PFT studies due to Covid concerns - predniSONE (DELTASONE) 10 MG tablet; Take 6 tabs on day one, decrease by one tab each day until finished  in six days  Dispense: 21 tablet; Refill: 0 - montelukast (SINGULAIR) 10 MG tablet; Take 1 tablet (10 mg total) by mouth at bedtime.  Dispense: 90 tablet; Refill: 1 - budesonide-formoterol (SYMBICORT) 80-4.5 MCG/ACT inhaler; Inhale 2 puffs into the lungs 2 (two) times daily.  Dispense: 1 Inhaler; Refill: 5  2. Excessive daytime sleepiness  Noted some concern for sleep apnea still exists He noted the sleep study was too costly, and are holding on that presently.  Emphasized the importance of weight loss to help here as well.  3. Class 2 obesity due to excess calories without serious comorbidity with body mass index (BMI) of 37.0 to 37.9 in adult  Discussed with the patient the risk posed by an increased BMI. Discussed importance of portion control, calorie counting and increasing physical activity weekly trying to get at least 30 minutes 5 times per week.  Discussed healthy foods and the importance of that in his diet.  He noted he is trying to get more active again and is attempting to try to lose weight now.  4.  Dysthymia  PhQ9 today reviewed. As noted above, he is not anxious to return to Wellbutrin or another medicine to help with any depression concerns, and really do feel he is not majorly depressed, do feel sleep is playing a role in this.  Do feel getting his asthma better controlled will be helpful and await his response.  Did recommend a Pneumovax, as well as getting the Covid vaccination when available, although I am not convinced he will proceed as there was some hesitancy with vaccinations both with him and his family that was noted.  We will follow-up in approximately 8 weeks time, sooner if more problematic, not improving as noted today.       Jamelle HaringLIFFORD D Raylynne Cubbage, MD 05/26/19 1:54 PM

## 2019-05-26 ENCOUNTER — Ambulatory Visit (INDEPENDENT_AMBULATORY_CARE_PROVIDER_SITE_OTHER): Payer: 59 | Admitting: Internal Medicine

## 2019-05-26 ENCOUNTER — Other Ambulatory Visit: Payer: Self-pay

## 2019-05-26 ENCOUNTER — Encounter: Payer: Self-pay | Admitting: Internal Medicine

## 2019-05-26 VITALS — BP 118/74 | HR 98 | Temp 97.1°F | Resp 20 | Ht 66.0 in | Wt 238.6 lb

## 2019-05-26 DIAGNOSIS — G4719 Other hypersomnia: Secondary | ICD-10-CM

## 2019-05-26 DIAGNOSIS — F341 Dysthymic disorder: Secondary | ICD-10-CM | POA: Diagnosis not present

## 2019-05-26 DIAGNOSIS — E6609 Other obesity due to excess calories: Secondary | ICD-10-CM | POA: Diagnosis not present

## 2019-05-26 DIAGNOSIS — Z6837 Body mass index (BMI) 37.0-37.9, adult: Secondary | ICD-10-CM

## 2019-05-26 DIAGNOSIS — J4541 Moderate persistent asthma with (acute) exacerbation: Secondary | ICD-10-CM

## 2019-05-26 MED ORDER — MONTELUKAST SODIUM 10 MG PO TABS: 10.0000 mg | ORAL_TABLET | Freq: Every day | ORAL | 1 refills | Status: DC

## 2019-05-26 MED ORDER — PREDNISONE 10 MG PO TABS: ORAL_TABLET | ORAL | 0 refills | Status: DC

## 2019-05-26 MED ORDER — BUDESONIDE-FORMOTEROL FUMARATE 80-4.5 MCG/ACT IN AERO: 2.0000 | INHALATION_SPRAY | Freq: Two times a day (BID) | RESPIRATORY_TRACT | 5 refills | Status: DC

## 2019-05-26 NOTE — Patient Instructions (Signed)
Asthma, Adult  Asthma is a long-term (chronic) condition in which the airways get tight and narrow. The airways are the breathing passages that lead from the nose and mouth down into the lungs. A person with asthma will have times when symptoms get worse. These are called asthma attacks. They can cause coughing, whistling sounds when you breathe (wheezing), shortness of breath, and chest pain. They can make it hard to breathe. There is no cure for asthma, but medicines and lifestyle changes can help control it. There are many things that can bring on an asthma attack or make asthma symptoms worse (triggers). Common triggers include:  Mold.  Dust.  Cigarette smoke.  Cockroaches.  Things that can cause allergy symptoms (allergens). These include animal skin flakes (dander) and pollen from trees or grass.  Things that pollute the air. These may include household cleaners, wood smoke, smog, or chemical odors.  Cold air, weather changes, and wind.  Crying or laughing hard.  Stress.  Certain medicines or drugs.  Certain foods such as dried fruit, potato chips, and grape juice.  Infections, such as a cold or the flu.  Certain medical conditions or diseases.  Exercise or tiring activities. Asthma may be treated with medicines and by staying away from the things that cause asthma attacks. Types of medicines may include:  Controller medicines. These help prevent asthma symptoms. They are usually taken every day.  Fast-acting reliever or rescue medicines. These quickly relieve asthma symptoms. They are used as needed and provide short-term relief.  Allergy medicines if your attacks are brought on by allergens.  Medicines to help control the body's defense (immune) system. Follow these instructions at home: Avoiding triggers in your home  Change your heating and air conditioning filter often.  Limit your use of fireplaces and wood stoves.  Get rid of pests (such as roaches and  mice) and their droppings.  Throw away plants if you see mold on them.  Clean your floors. Dust regularly. Use cleaning products that do not smell.  Have someone vacuum when you are not home. Use a vacuum cleaner with a HEPA filter if possible.  Replace carpet with wood, tile, or vinyl flooring. Carpet can trap animal skin flakes and dust.  Use allergy-proof pillows, mattress covers, and box spring covers.  Wash bed sheets and blankets every week in hot water. Dry them in a dryer.  Keep your bedroom free of any triggers.  Avoid pets and keep windows closed when things that cause allergy symptoms are in the air.  Use blankets that are made of polyester or cotton.  Clean bathrooms and kitchens with bleach. If possible, have someone repaint the walls in these rooms with mold-resistant paint. Keep out of the rooms that are being cleaned and painted.  Wash your hands often with soap and water. If soap and water are not available, use hand sanitizer.  Do not allow anyone to smoke in your home. General instructions  Take over-the-counter and prescription medicines only as told by your doctor. ? Talk with your doctor if you have questions about how or when to take your medicines. ? Make note if you need to use your medicines more often than usual.  Do not use any products that contain nicotine or tobacco, such as cigarettes and e-cigarettes. If you need help quitting, ask your doctor.  Stay away from secondhand smoke.  Avoid doing things outdoors when allergen counts are high and when air quality is low.  Wear a ski mask   when doing outdoor activities in the winter. The mask should cover your nose and mouth. Exercise indoors on cold days if you can.  Warm up before you exercise. Take time to cool down after exercise.  Use a peak flow meter as told by your doctor. A peak flow meter is a tool that measures how well the lungs are working.  Keep track of the peak flow meter's readings.  Write them down.  Follow your asthma action plan. This is a written plan for taking care of your asthma and treating your attacks.  Make sure you get all the shots (vaccines) that your doctor recommends. Ask your doctor about a flu shot and a pneumonia shot.  Keep all follow-up visits as told by your doctor. This is important. Contact a doctor if:  You have wheezing, shortness of breath, or a cough even while taking medicine to prevent attacks.  The mucus you cough up (sputum) is thicker than usual.  The mucus you cough up changes from clear or white to yellow, green, gray, or bloody.  You have problems from the medicine you are taking, such as: ? A rash. ? Itching. ? Swelling. ? Trouble breathing.  You need reliever medicines more than 2-3 times a week.  Your peak flow reading is still at 50-79% of your personal best after following the action plan for 1 hour.  You have a fever. Get help right away if:  You seem to be worse and are not responding to medicine during an asthma attack.  You are short of breath even at rest.  You get short of breath when doing very little activity.  You have trouble eating, drinking, or talking.  You have chest pain or tightness.  You have a fast heartbeat.  Your lips or fingernails start to turn blue.  You are light-headed or dizzy, or you faint.  Your peak flow is less than 50% of your personal best.  You feel too tired to breathe normally. Summary  Asthma is a long-term (chronic) condition in which the airways get tight and narrow. An asthma attack can make it hard to breathe.  Asthma cannot be cured, but medicines and lifestyle changes can help control it.  Make sure you understand how to avoid triggers and how and when to use your medicines. This information is not intended to replace advice given to you by your health care provider. Make sure you discuss any questions you have with your health care provider. Document Revised:  06/19/2018 Document Reviewed: 05/21/2016 Elsevier Patient Education  2020 Elsevier Inc.  

## 2019-07-20 NOTE — Progress Notes (Deleted)
No Show for visit

## 2019-07-21 ENCOUNTER — Ambulatory Visit: Payer: 59 | Admitting: Internal Medicine

## 2019-08-07 ENCOUNTER — Telehealth: Payer: Self-pay | Admitting: Family Medicine

## 2019-08-07 NOTE — Telephone Encounter (Signed)
Pt called in to ask if the office has a coupon for budesonide-formoterol (SYMBICORT) 80-4.5 MCG/ACT inhaler? Please assist.    [phone]

## 2019-08-11 NOTE — Telephone Encounter (Signed)
Called patient no voice mail set up

## 2019-08-11 NOTE — Telephone Encounter (Signed)
We no longer have Symbicort coupons

## 2019-10-05 ENCOUNTER — Ambulatory Visit: Payer: Self-pay

## 2019-10-05 NOTE — Telephone Encounter (Signed)
Pt. Reports his asthma started flaring up last week. Albuterol "is not helping." States is also using Symbicort, but is out of it currently. Has runny nose, shortness of breath. Not sleeping well at night. No availability in the office per Melissa. Pt. Will go to UC. Also asking for a Pulmonary referral. Please advise. Reason for Disposition . [1] MODERATE asthma attack (e.g., SOB at rest, speaks in phrases, audible wheezes) AND [2] not resolved after 2 nebulizer or inhaler treatments given 20 minutes apart  Answer Assessment - Initial Assessment Questions 1. RESPIRATORY STATUS: "Describe your breathing?" (e.g., wheezing, shortness of breath, unable to speak, severe coughing)      Shortness of breath 2. ONSET: "When did this asthma attack begin?"      Last week 3. TRIGGER: "What do you think triggered this attack?" (e.g., URI, exposure to pollen or other allergen, tobacco smoke)      Unsure 4. PEAK EXPIRATORY FLOW RATE (PEFR): "Do you use a peak flow meter?" If so, ask: "What's the current peak flow? What's your personal best peak flow?"      No 5. SEVERITY: "How bad is this attack?"    - MILD: No SOB at rest, mild SOB with walking, speaks normally in sentences, can lay down, no retractions, pulse < 100. (GREEN Zone: PEFR 80-100%)   - MODERATE: SOB at rest, SOB with minimal exertion and prefers to sit, cannot lie down flat, speaks in phrases, mild retractions, audible wheezing, pulse 100-120. (YELLOW Zone: PEFR 50-80%)    - SEVERE: Very SOB at rest, speaks in single words, struggling to breathe, sitting hunched forward, retractions, usually loud wheezing, sometimes minimal wheezing because of decreased air movement, pulse > 120. (RED Zone: PEFR < 50%).      Moderate 6. MEDICATIONS (Inhaler or nebs): "What are your asthma medications?" and "What treatments have you given so far?"    - Quick-relief: albuterol, metaproterenol, salbutamol, or other inhaled or nebulized beta-agonist medicines   -  Long-term-control: steroids, cromolyn, or other anti-inflammatory medicines.     Albuterol helping 7. OTHER SYMPTOMS: "Do you have any other symptoms? (e.g., runny nose, chest pain, fever)     Congested nose, chest tightness 8. PREGNANCY: "Is there any chance you are pregnant?" "When was your last menstrual period?"     N/A  Protocols used: ASTHMA ATTACK-A-AH

## 2019-10-05 NOTE — Telephone Encounter (Signed)
Await UC evaluation presently.

## 2019-10-05 NOTE — Telephone Encounter (Signed)
Patient going to UC but is asking for Pulmonology referral

## 2020-01-13 ENCOUNTER — Other Ambulatory Visit: Payer: Self-pay

## 2020-01-13 DIAGNOSIS — J4541 Moderate persistent asthma with (acute) exacerbation: Secondary | ICD-10-CM

## 2020-01-13 MED ORDER — BUDESONIDE-FORMOTEROL FUMARATE 80-4.5 MCG/ACT IN AERO: 2.0000 | INHALATION_SPRAY | Freq: Two times a day (BID) | RESPIRATORY_TRACT | 5 refills | Status: DC

## 2020-01-21 ENCOUNTER — Ambulatory Visit (INDEPENDENT_AMBULATORY_CARE_PROVIDER_SITE_OTHER): Payer: 59 | Admitting: Internal Medicine

## 2020-01-21 ENCOUNTER — Other Ambulatory Visit: Payer: Self-pay

## 2020-01-21 ENCOUNTER — Other Ambulatory Visit: Payer: Self-pay | Admitting: Internal Medicine

## 2020-01-21 ENCOUNTER — Encounter: Payer: Self-pay | Admitting: Internal Medicine

## 2020-01-21 VITALS — BP 120/80 | HR 96 | Temp 97.9°F | Resp 16 | Ht 66.0 in | Wt 241.0 lb

## 2020-01-21 DIAGNOSIS — R0981 Nasal congestion: Secondary | ICD-10-CM | POA: Diagnosis not present

## 2020-01-21 DIAGNOSIS — J4541 Moderate persistent asthma with (acute) exacerbation: Secondary | ICD-10-CM

## 2020-01-21 MED ORDER — ALBUTEROL SULFATE HFA 108 (90 BASE) MCG/ACT IN AERS: 2.0000 | INHALATION_SPRAY | Freq: Four times a day (QID) | RESPIRATORY_TRACT | 3 refills | Status: DC | PRN

## 2020-01-21 MED ORDER — PREDNISONE 10 MG PO TABS: ORAL_TABLET | ORAL | 0 refills | Status: DC

## 2020-01-21 MED ORDER — FLUTICASONE PROPIONATE (INHAL) 100 MCG/BLIST IN AEPB
1.0000 | INHALATION_SPRAY | Freq: Two times a day (BID) | RESPIRATORY_TRACT | 5 refills | Status: DC
Start: 2020-01-21 — End: 2020-01-21

## 2020-01-21 NOTE — Patient Instructions (Signed)
Please pick up the medicines prescribed to the pharmacy. Take the oral prednisone as directed Also use the inhaled steroid via the Diskus prescribed twice daily Refill the albuterol inhaler to use as needed

## 2020-01-21 NOTE — Progress Notes (Signed)
Patient ID: Darren Kim, male    DOB: 29-Sep-1990, 29 y.o.   MRN: [id number]  PCP: Jamelle Haring, MD  Chief Complaint  Patient presents with  . Asthma    Subjective:   Darren Kim is a 29 y.o. male, presents to clinic with CC of the following:  Chief Complaint  Patient presents with  . Asthma    HPI:  Patient is a 29 year old male Last visit was 05/26/2019 Recommended an approximate 8-week follow-up after that visit. Follows up today.  To review,  Asthma and Allergies: He has had asthma his entire life (diagnosed at age 28), and was noted to be struggling with symptoms on prior visits that included shortness of breath, coughing, and chest tightness. He had symbicort, Singulair, duoneb, prn albuterol inhaler. On pulmonary evaluation 01/06/2019, a CXR was done and normal, and he was dx'ed with severe persistent asthma and the symbicort was changed to a Breo inhaler once daily, singulair was again rec'ed as he had not yet started, and albuterol inhaler to continue prn. Complete tob cessation recommended as well as he noted occas marijuana use then.   He was noted by pulmonary to not have documented allergies, but noted some allergy sx's with certain exposures. Was recommended flonase and antihistamines to use. No EIA component was felt to exist (not worsened by activity, and actually seemed a little better when active)   On our last visit in January, he noted he had not improved much at all after seeing pulmonary, often was wheezing, occasionally short of breath, not sleeping well, and he had stopped all tobacco use.  He did note certain exacerbants, does not have any pets. The following was recommended:   He noted not using a steroid inhaler all that frequently, and educated at length on the importance of the steroid inhaler and how it will not have as much of a benefit acutely as the albuterol but is extremely important to helping him get better.  Felt best  to add a Dosepak of the prednisone in the short-term, as he increases use of the steroid inhaler and emphasized the importance of taking the Symbicort 2 puffs twice daily about 12 hours apart.  Did mention the possibility of a Diskus type product, which may be more helpful in delivering the medicine to the needed area, and may add in the future pending his status.  Also refilled the Singulair product to take nightly and he thinks that may be a little helpful. Continue with all tobacco cessation as doing presently Noted limited presently not being able to get spirometry or PFT studies due to Covid concerns  At today's visit Medications currently taking - symbicort  has been using more than twice daily in the recent past due to symptoms, duoneb - uses 1-2X/day recent past, Albuterol inhaler prn, often 3-4 times/day and notes helps for 10 to 20 minutes, although not lasting.  He is not taking any other medicines presently, including not taking Singulair  He notes that his symptoms in this past week have again been more persistent. He notes he often wheezes when symptomatic, not marked cough, no production, no fevers, no increased mucus or postnasal drip.  Does  get short of breath with symptoms and has increased tightness in the chest. He notes he is not sleeping well, often up in the middle night with breathing concerns.  He notes he would like something different from the Symbicort, as his medicines are causing him 100s of  dollars when he needs to get that refilled. He did not get the Covid vaccine. He states he has a appointment scheduled with a lung specialist in [place] on October 18 as he wanted to see a pulmonary specialist again to be assessed.  Symptoms and signs of obstructive sleep apnea were also noted by pulmonary in the past (GF noted he snores and sometimes stops breathing in his sleep and noted some daytime somnolence) and a home sleep study was ordered to evaluate OSA  concern/contribution  Some sleep hygeine concerns also noted, with social media use noted at bedtime. Also noted that he often awakes in middle of night and wakes up in the morning not feeling rested.  The sleep study has not yet been completed, as he noted it was a cost issue, and was too expensive      1. Moderate persistent asthma with (acute) exacerbation  Currently not very well controlled, and not taking the steroid inhaler frequently.  He notes it does not seem to be as helpful for him as the albuterol inhaler.  Educated at length on the importance of the steroid inhaler and how it will not have as much of a benefit acutely as the albuterol but is extremely important to helping him get better.  Felt best to add a Dosepak of the prednisone in the short-term, as he increases use of the steroid inhaler and emphasized the importance of taking the Symbicort 2 puffs twice daily about 12 hours apart.  Did mention the possibility of a Diskus type product, which may be more helpful in delivering the medicine to the needed area, and may add in the future pending his status.  Also refilled the Singulair product to take nightly and he thinks that may be a little helpful. Continue with all tobacco cessation as doing presently Noted limited presently not being able to get spirometry or PFT studies due to Covid concerns - predniSONE (DELTASONE) 10 MG tablet; Take 6 tabs on day one, decrease by one tab each day until finished in six days  Dispense: 21 tablet; Refill: 0 - montelukast (SINGULAIR) 10 MG tablet; Take 1 tablet (10 mg total) by mouth at bedtime.  Dispense: 90 tablet; Refill: 1 - budesonide-formoterol (SYMBICORT) 80-4.5 MCG/ACT inhaler; Inhale 2 puffs into the lungs 2 (two) times daily.  Dispense: 1 Inhaler; Refill: 5  2. Excessive daytime sleepiness  Noted some concern for sleep apnea still exists He noted the sleep study was too costly, and are holding on that presently.  Emphasized the  importance of weight loss to help here as well.  3. Class 2 obesity due to excess calories without serious comorbidity with body mass index (BMI) of 37.0 to 37.9 in adult  Discussed with the patient the risk posed by an increased BMI. Discussed importance of portion control, calorie counting and increasing physical activity weekly trying to get at least 30 minutes 5 times per week.  Discussed healthy foods and the importance of that in his diet.  He noted he is trying to get more active again and is attempting to try to lose weight now.  4. Dysthymia  PhQ9 today reviewed. As noted above, he is not anxious to return to Wellbutrin or another medicine to help with any depression concerns, and really do feel he is not majorly depressed, do feel sleep is playing a role in this.  Do feel getting his asthma better controlled will be helpful and await his response.  Did recommend a Pneumovax, as well  as getting the Covid vaccination when available, although I am not convinced he will proceed as there was some hesitancy with vaccinations both with him and his family that was noted.  We will follow-up in approximately 8 weeks time, sooner if more problematic, not improving as noted today.  Patient Active Problem List   Diagnosis Date Noted  . Excessive daytime sleepiness 05/26/2019  . Dysthymia 05/26/2019  . Moderate persistent asthma with (acute) exacerbation 11/27/2018  . Class 2 obesity due to excess calories without serious comorbidity with body mass index (BMI) of 37.0 to 37.9 in adult 11/27/2018      Current Outpatient Medications:  .  albuterol (VENTOLIN HFA) 108 (90 Base) MCG/ACT inhaler, Inhale 2 puffs into the lungs every 6 (six) hours as needed for wheezing or shortness of breath., Disp: 18 g, Rfl: 3 .  fluticasone (FLONASE) 50 MCG/ACT nasal spray, Place 2 sprays into both nostrils daily., Disp: 16 g, Rfl: 6 .  ipratropium-albuterol (DUONEB) 0.5-2.5 (3) MG/3ML SOLN, Take 3 mLs by  nebulization every 4 (four) hours as needed., Disp: 360 mL, Rfl: 3 .  montelukast (SINGULAIR) 10 MG tablet, Take 1 tablet (10 mg total) by mouth at bedtime., Disp: 90 tablet, Rfl: 1 .  buPROPion (WELLBUTRIN XL) 150 MG 24 hr tablet, Take 1 tablet (150 mg total) by mouth daily. (Patient not taking: Reported on 04/06/2019), Disp: 90 tablet, Rfl: 1 .  Fluticasone Propionate, Inhal, 100 MCG/BLIST AEPB, Inhale 1 Dose into the lungs 2 (two) times daily., Disp: 60 each, Rfl: 5 .  predniSONE (DELTASONE) 10 MG tablet, Take four tabs daily x 2 days, then three tabs X 2 days, then two tabs daily X 2 days, then one tab daily x 2 days, Disp: 20 tablet, Rfl: 0   No Known Allergies   History reviewed. No pertinent surgical history.   History reviewed. No pertinent family history.   Social History   Tobacco Use  . Smoking status: Never Smoker  . Smokeless tobacco: Never Used  Substance Use Topics  . Alcohol use: Not Currently    With staff assistance, above reviewed with the patient today.  ROS: As per HPI, otherwise no specific complaints on a limited and focused system review   No results found for this or any previous visit (from the past 72 hour(s)).   PHQ2/9: Depression screen [facility] 2/9 01/21/2020 01/21/2020 05/26/2019 04/06/2019 01/02/2019  Decreased Interest 0 0 2 1 0  Down, Depressed, Hopeless 0 0 0 1 0  PHQ - 2 Score 0 0 2 2 0  Altered sleeping - - 3 1 3   Tired, decreased energy - - 3 1 0  Change in appetite - - 2 2 0  Feeling bad or failure about yourself  - - 0 2 0  Trouble concentrating - - 0 1 0  Moving slowly or fidgety/restless - - 0 1 0  Suicidal thoughts - - 0 0 0  PHQ-9 Score - - 10 10 3   Difficult doing work/chores - - Somewhat difficult Somewhat difficult Not difficult at all   PHQ-2/9 Result is neg  Fall Risk: Fall Risk  01/21/2020 01/21/2020 05/26/2019 04/06/2019 01/02/2019  Falls in the past year? 0 0 0 0 0  Number falls in past yr: 0 0 0 0 0  Injury with Fall? 0 0 0 0 0    Follow up Falls evaluation completed Falls evaluation completed - Falls evaluation completed Falls evaluation completed      Objective:   Vitals:  01/21/20 1314  BP: 120/80  Pulse: 96  Resp: 16  Temp: 97.9 F (36.6 C)  TempSrc: Oral  SpO2: 97%  Weight: 241 lb (109.3 kg)  Height: 5\' 6"  (1.676 m)    Body mass index is 38.9 kg/m.  Physical Exam    NAD, masked, obese, pleasant HEENT - [place]/AT, sclera anicteric, PERRL, EOMI, conj - non-inj'ed, pharynx clear Neck - supple, no adenopathy,  Car - RRR without m/g/r Pulm- RR and effort normal at rest, + diffuse wheezes noted, more in expiration, no focal rales, air movement was somewhat limited  Abd - soft, obese, NT Skin- no rash noted, + tattoos on UE's Neuro/psychiatric - affect was not flat, appropriate with conversation             Alert and oriented             Grossly non-focal              Speech  normal    Results for orders placed or performed in visit on 11/27/18  Lipid panel  Result Value Ref Range   Cholesterol 145 <200 mg/dL   HDL 42 > OR = 40 mg/dL   Triglycerides 88 11/29/18 mg/dL   LDL Cholesterol (Calc) 85 mg/dL (calc)   Total CHOL/HDL Ratio 3.5 <5.0 (calc)   Non-HDL Cholesterol (Calc) 103 <130 mg/dL (calc)  COMPLETE METABOLIC PANEL WITH GFR  Result Value Ref Range   Glucose, Bld 99 65 - 99 mg/dL   BUN 21 7 - 25 mg/dL   Creat <371 (H) 0.62 - 1.35 mg/dL   GFR, Est Non African American 62 > OR = 60 mL/min/1.21m2   GFR, Est African American 72 > OR = 60 mL/min/1.13m2   BUN/Creatinine Ratio 14 6 - 22 (calc)   Sodium 141 135 - 146 mmol/L   Potassium 5.2 3.5 - 5.3 mmol/L   Chloride 109 98 - 110 mmol/L   CO2 18 (L) 20 - 32 mmol/L   Calcium 10.2 8.6 - 10.3 mg/dL   Total Protein 7.4 6.1 - 8.1 g/dL   Albumin 4.7 3.6 - 5.1 g/dL   Globulin 2.7 1.9 - 3.7 g/dL (calc)   AG Ratio 1.7 1.0 - 2.5 (calc)   Total Bilirubin 1.3 (H) 0.2 - 1.2 mg/dL   Alkaline phosphatase (APISO) 42 36 - 130 U/L   AST 33 10 - 40 U/L    ALT 53 (H) 9 - 46 U/L  RPR  Result Value Ref Range   RPR Ser Ql NON-REACTIVE NON-REACTI  HIV Antibody (routine testing w rflx)  Result Value Ref Range   HIV 1&2 Ab, 4th Generation NON-REACTIVE NON-REACTI  Hepatitis C antibody  Result Value Ref Range   Hepatitis C Ab NON-REACTIVE NON-REACTI   SIGNAL TO CUT-OFF 0.08 <1.00  Urine cytology ancillary only  Result Value Ref Range   Chlamydia Negative    Neisseria Gonorrhea Negative        Assessment & Plan:   1. Moderate persistent asthma with acute exacerbation Patient with flare of his asthma, and again noted the Symbicort is not helpful taking more frequently as needed, and albuterol is the inhaler to use in that realm.  He noted he has been trying to use the Symbicort 3 or 4 times a day recently to help with symptoms. No recent infectious symptoms of concern, nor concerns for Covid presently Felt best to add an oral steroid, with the prednisone below prescribed, and again a wean over time prescribed Felt best to  change to a discus product, as this mode of delivery may be more helpful clinically.  Initially wanted to add an Advair product, that had a long-acting beta agonist included, although cost concerns an issue, and it is hoped the generic fluticasone blister pack product will be more cost effective and should use twice daily for maintenance Continue the albuterol inhaler as needed, with the goal to use it much less frequently over time. He also has a nebulizer product to use as needed in the short-term at home, He does have a follow-up with a lung specialist in October, and will keep that appointment. Singulair was another product he had used in the past, although has not been using recently, again with cost issues of concern.  Most importantly presently is the oral steroid, and then maintenance with a routine steroid, and hopefully the Diskus product will be able to be obtained.  If that is cost prohibitive, can contact us, and we  will try to work with his in pharmacy and insurance trying to get the most cost effective inhaled steroid as I do feel that is important to continue over time.  - predniSONE (DELTASONE) 10 MG tablet; Take four tabs daily x 2 days, then three tabs X 2 days, then two tabs daily X 2 days, then one tab daily x 2 days  Dispense: 20 tablet; Refill: 0 - Fluticasone Propionate, Inhal, 100 MCG/BLIST AEPB; Inhale 1 Dose into the lungs 2 (two) times daily.  Dispense: 60 each; Refill: 5 - albuterol (VENTOLIN HFA) 108 (90 Base) MCG/ACT inhaler; Inhale 2 puffs into the lungs every 6 (six) hours as needed for wheezing or shortness of breath.  Dispense: 18 g; Refill: 3   Should follow-up if not improving or more problematic, and keep his follow-up with the lung specialist in October as planned.       Jamelle Haring, MD 01/21/20 1:51 PM

## 2020-03-07 NOTE — Progress Notes (Signed)
Name: Darren Kim   MRN: [id number]    DOB: March 25, 1991   Date:03/08/2020       Progress Note  Subjective  Chief Complaint  Chief Complaint  Patient presents with  . Covid Exposure  . Medication Refill  . Back Pain    I connected with  Caldwell Kronenberger Griffin on 03/08/20 at  9:00 AM EST by telephone and verified that I am speaking with the correct person using two identifiers.  I discussed the limitations, risks, security and privacy concerns of performing an evaluation and management service by telephone and the availability of in person appointments. The patient expressed understanding and agreed to proceed. Staff also discussed with the patient that there may be a patient responsible charge related to this service. Patient Location: Home Provider Location: [facility] Additional Individuals present: none  HPI  Patient is a 29 year old male Last visit with me was in September 2021 Follows up today with Covid concern Was seen in ER yesterday and had CXR done, no abx added, no other meds added after he sat there for 12 hours noted by him.   He followed up with pulmonary in October 2021 with the following assessment/plan noted:   1. Dyspnea, unspecified type  2. Asthma, unspecified asthma severity, unspecified whether complicated, unspecified whether persistent  3. Daytime hypersomnia  4. Snoring   29 year old man, former smoker with history of severe persistent asthma with frequently prednisone use, presents for transfer of care and evaluation regarding worsening asthma control. He is currently using an ICS inhaler but will benefit from a triple regimen. He most likely has some environmental allergies and underlying sleep apnea that could be worsening his asthma control. He agrees to a sleep study and CPAP therapy if needed. Plan    Sleep study and cpap titration if needed (split if meets AASM criteria)  Allergy skin test  Start trelegy once a day, rinse mouth after each use   Stop arnuity  Albuterol as needed  CBC with diff, IgE  Annual influenza vaccination if no contraindications and pneumonia vaccination per current guidelines.    Has appt with them tomorrow and canceled due to having Covid.    Patient recently with Covid, test result was positive 4 days ago, 11/5 at [facility]  Sx's started on 10/31  Notes some symptoms that persist include: + cough, not bad, but often uncomfortable when coughs, + production with the cough No marked SOB, some with exertion, occas with movements No fever now, feeling feverish intermittently No sore throat unless coughs, and no marled sinus congestion No loss of smell, loss of taste No N/V + muscle aches, + back aches Appetite lessened some, staying well hydrated No marked loose stools/diarrhea + CP related to the cough, more feels very tight.  Taking tylenol, theraflu day, nyquil at night. 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 Excessive daytime sleepiness 05/26/2019  . Dysthymia 05/26/2019  . Moderate persistent asthma with (acute) exacerbation 11/27/2018  . Class 2 obesity due to excess calories without serious comorbidity with body mass index (BMI) of 37.0  to 37.9 in adult 11/27/2018    History reviewed. No pertinent surgical history.  History reviewed. No pertinent family history.  Social History   Tobacco Use  . Smoking status: Never Smoker  . Smokeless tobacco: Never Used  Substance Use Topics  . Alcohol use: Not Currently     Current Outpatient Medications:  .  Fluticasone Furoate (ARNUITY ELLIPTA) 100 MCG/ACT AEPB, Inhale into the  lungs., Disp: , Rfl:  .  Fluticasone-Umeclidin-Vilant (TRELEGY ELLIPTA) 200-62.5-25 MCG/INH AEPB, Inhale into the lungs., Disp: , Rfl:  .  albuterol (VENTOLIN HFA) 108 (90 Base) MCG/ACT inhaler, Inhale 2 puffs into the lungs every 6 (six) hours as needed for wheezing or shortness of breath., Disp: 18 g, Rfl: 3 .  buPROPion (WELLBUTRIN XL) 150 MG 24 hr tablet, Take 1 tablet (150 mg total) by mouth daily. (Patient not taking: Reported on 04/06/2019), Disp: 90 tablet, Rfl: 1 .  fluticasone (FLONASE) 50 MCG/ACT nasal spray, Place 2 sprays into both nostrils daily., Disp: 16 g, Rfl: 6 .  ipratropium-albuterol (DUONEB) 0.5-2.5 (3) MG/3ML SOLN, Take 3 mLs by nebulization every 4 (four) hours as needed., Disp: 360 mL, Rfl: 3 .  montelukast (SINGULAIR) 10 MG tablet, Take 1 tablet (10 mg total) by mouth at bedtime., Disp: 90 tablet, Rfl: 1 .  predniSONE (DELTASONE) 10 MG tablet, Take four tabs daily x 2 days, then three tabs X 2 days, then two tabs daily X 2 days, then one tab daily x 2 days, Disp: 20 tablet, Rfl: 0  No Known Allergies  With staff assistance, above reviewed with the patient today.  ROS: As per HPI, otherwise no specific complaints on a limited and focused system review   Objective  Virtual encounter, vitals not obtained.  There is no height or weight on file to calculate BMI.  Physical Exam   Appears in NAD via conversation, no coughing episodes during our conversation Breathing: No obvious respiratory distress. Speaking in complete sentences Neurological: Pt is alert, Speech is normal Psychiatric: Patient has a normal mood and affect, Judgment and thought content normal.   No results found for this or any previous visit (from the past 72 hour(s)).  PHQ2/9: Depression screen [facility] 2/9 03/08/2020 01/21/2020 01/21/2020 05/26/2019 04/06/2019  Decreased Interest 0 0 0 2 1  Down, Depressed, Hopeless 0 0 0 0 1  PHQ - 2 Score 0 0 0 2 2  Altered sleeping - - - 3 1  Tired, decreased energy  - - - 3 1  Change in appetite - - - 2 2  Feeling bad or failure about yourself  - - - 0 2  Trouble concentrating - - - 0 1  Moving slowly or fidgety/restless - - - 0 1  Suicidal thoughts - - - 0 0  PHQ-9 Score - - - 10 10  Difficult doing work/chores - - - Somewhat difficult Somewhat difficult   PHQ-2/9 Result reviewed  Fall Risk: Fall Risk  03/08/2020 01/21/2020 01/21/2020 05/26/2019 04/06/2019  Falls in the past year? 0 0 0 0 0  Number falls in past yr: 0 0 0 0 0  Injury with Fall? 0 0 0 0 0  Follow up - Falls evaluation completed Falls evaluation completed - Falls evaluation completed     Assessment & Plan  1. COVID-19  Educated on Covid and management recommendations, monoclonal antibody treatments entertained for non-hospitalized patients at risk for severe disease within 10 days of symptom onset and given the timeframe of when his symptoms started,  not eligible for the antibiotic infusion. Recommend continued treatment and symptomatic measures - stay well hydrated, rest, a mucinex or robitussin type OTC product take as needed may be helpful as an expectorant   Do feel using an albuterol inhaler is best 3 times a day routinely in the short-term to help with his chest tightness and clearance.  Then as symptoms resolving, go back to using just as needed.  Can continue the Trelegy inhaler as well.  He noted he is running out of that soon, and will not be able to follow-up with pulmonary tomorrow as scheduled due to Covid.  Did asked that he call their office to ask if they want him to continue on that medicine, and if so they will refill that for him.  Also adding a prednisone product, with a wean as ordered felt best to add presently.  Reassured that he had a negative chest x-ray yesterday with his ER visit.  Remain isolated presently until recovered and RTW discussed - recommended ok to return if at least 10 days since first symptoms, no fever at least 24-48 hours since recovery and  other sx's have much improved   If sx's worsening acutely, such as increased SOB, fevers, worsening chest pains, or other concerning sx's arise, may need to be seen again more emergently in the ER for more immediate evaluation.  He was understanding of that.  Also to follow-up if symptoms not improving over time as discussed.  2. Moderate persistent asthma, unspecified whether complicated As noted above, continues to follow with pulmonary as planned, with the recommended phone call as noted above.   I discussed the assessment and treatment plan with the patient. The patient was provided an opportunity to ask questions and all were answered. The patient agreed with the plan and demonstrated an understanding of the instructions.  Red flags and when to present for emergency care or RTC including fevers, chest pain, shortness of breath, new/worsening/un-resolving symptoms reviewed with patient at time of visit.   The patient was advised to call back or seek an in-person evaluation if the symptoms worsen or if the condition fails to improve as anticipated.  I provided 20 minutes of non-face-to-face time during this encounter that included discussing at length patient's sx/history, pertinent pmhx, medications, treatment and follow up plan. This time also included the necessary documentation, orders, and chart review.  Jamelle Haring, MD

## 2020-03-08 ENCOUNTER — Other Ambulatory Visit: Payer: Self-pay

## 2020-03-08 ENCOUNTER — Encounter: Payer: Self-pay | Admitting: Internal Medicine

## 2020-03-08 ENCOUNTER — Ambulatory Visit (INDEPENDENT_AMBULATORY_CARE_PROVIDER_SITE_OTHER): Payer: 59 | Admitting: Internal Medicine

## 2020-03-08 ENCOUNTER — Encounter: Payer: 59 | Admitting: Internal Medicine

## 2020-03-08 DIAGNOSIS — G4719 Other hypersomnia: Secondary | ICD-10-CM | POA: Diagnosis not present

## 2020-03-08 DIAGNOSIS — U071 COVID-19: Secondary | ICD-10-CM

## 2020-03-08 DIAGNOSIS — E66812 Obesity, class 2: Secondary | ICD-10-CM

## 2020-03-08 DIAGNOSIS — E6609 Other obesity due to excess calories: Secondary | ICD-10-CM | POA: Diagnosis not present

## 2020-03-08 DIAGNOSIS — J4540 Moderate persistent asthma, uncomplicated: Secondary | ICD-10-CM | POA: Diagnosis not present

## 2020-03-08 DIAGNOSIS — Z6837 Body mass index (BMI) 37.0-37.9, adult: Secondary | ICD-10-CM

## 2020-03-08 MED ORDER — PREDNISONE 10 MG PO TABS: ORAL_TABLET | ORAL | 0 refills | Status: DC

## 2020-06-20 ENCOUNTER — Other Ambulatory Visit: Payer: Self-pay

## 2020-06-20 ENCOUNTER — Encounter: Payer: Self-pay | Admitting: Family Medicine

## 2020-06-20 ENCOUNTER — Ambulatory Visit (INDEPENDENT_AMBULATORY_CARE_PROVIDER_SITE_OTHER): Payer: 59 | Admitting: Family Medicine

## 2020-06-20 VITALS — BP 124/78 | HR 98 | Temp 98.0°F | Resp 18 | Ht 66.0 in | Wt 239.1 lb

## 2020-06-20 DIAGNOSIS — J4541 Moderate persistent asthma with (acute) exacerbation: Secondary | ICD-10-CM

## 2020-06-20 DIAGNOSIS — E66812 Obesity, class 2: Secondary | ICD-10-CM

## 2020-06-20 DIAGNOSIS — F341 Dysthymic disorder: Secondary | ICD-10-CM

## 2020-06-20 DIAGNOSIS — E669 Obesity, unspecified: Secondary | ICD-10-CM

## 2020-06-20 DIAGNOSIS — Z6838 Body mass index (BMI) 38.0-38.9, adult: Secondary | ICD-10-CM

## 2020-06-20 DIAGNOSIS — G4719 Other hypersomnia: Secondary | ICD-10-CM

## 2020-06-20 DIAGNOSIS — J45990 Exercise induced bronchospasm: Secondary | ICD-10-CM | POA: Diagnosis not present

## 2020-06-20 MED ORDER — ALBUTEROL SULFATE HFA 108 (90 BASE) MCG/ACT IN AERS: INHALATION_SPRAY | RESPIRATORY_TRACT | 5 refills | Status: DC

## 2020-06-20 MED ORDER — PREDNISONE 20 MG PO TABS: ORAL_TABLET | ORAL | 0 refills | Status: DC

## 2020-06-20 MED ORDER — MONTELUKAST SODIUM 10 MG PO TABS: 10.0000 mg | ORAL_TABLET | Freq: Every day | ORAL | 1 refills | Status: DC

## 2020-06-20 NOTE — Patient Instructions (Signed)
Let me know if you can get trelegy with the coupon - if not I will send in the symbicort  I do want to recheck you in the next 1-2 months to make sure you can get all your meds and that your asthma is getting better controlled.   Asthma, Adult  Asthma is a long-term (chronic) condition in which the airways get tight and narrow. The airways are the breathing passages that lead from the nose and mouth down into the lungs. A person with asthma will have times when symptoms get worse. These are called asthma attacks. They can cause coughing, whistling sounds when you breathe (wheezing), shortness of breath, and chest pain. They can make it hard to breathe. There is no cure for asthma, but medicines and lifestyle changes can help control it. There are many things that can bring on an asthma attack or make asthma symptoms worse (triggers). Common triggers include:  Mold.  Dust.  Cigarette smoke.  Cockroaches.  Things that can cause allergy symptoms (allergens). These include animal skin flakes (dander) and pollen from trees or grass.  Things that pollute the air. These may include household cleaners, wood smoke, smog, or chemical odors.  Cold air, weather changes, and wind.  Crying or laughing hard.  Stress.  Certain medicines or drugs.  Certain foods such as dried fruit, potato chips, and grape juice.  Infections, such as a cold or the flu.  Certain medical conditions or diseases.  Exercise or tiring activities. Asthma may be treated with medicines and by staying away from the things that cause asthma attacks. Types of medicines may include:  Controller medicines. These help prevent asthma symptoms. They are usually taken every day.  Fast-acting reliever or rescue medicines. These quickly relieve asthma symptoms. They are used as needed and provide short-term relief.  Allergy medicines if your attacks are brought on by allergens.  Medicines to help control the body's defense  (immune) system. Follow these instructions at home: Avoiding triggers in your home  Change your heating and air conditioning filter often.  Limit your use of fireplaces and wood stoves.  Get rid of pests (such as roaches and mice) and their droppings.  Throw away plants if you see mold on them.  Clean your floors. Dust regularly. Use cleaning products that do not smell.  Have someone vacuum when you are not home. Use a vacuum cleaner with a HEPA filter if possible.  Replace carpet with wood, tile, or vinyl flooring. Carpet can trap animal skin flakes and dust.  Use allergy-proof pillows, mattress covers, and box spring covers.  Wash bed sheets and blankets every week in hot water. Dry them in a dryer.  Keep your bedroom free of any triggers.  Avoid pets and keep windows closed when things that cause allergy symptoms are in the air.  Use blankets that are made of polyester or cotton.  Clean bathrooms and kitchens with bleach. If possible, have someone repaint the walls in these rooms with mold-resistant paint. Keep out of the rooms that are being cleaned and painted.  Wash your hands often with soap and water. If soap and water are not available, use hand sanitizer.  Do not allow anyone to smoke in your home. General instructions  Take over-the-counter and prescription medicines only as told by your doctor. ? Talk with your doctor if you have questions about how or when to take your medicines. ? Make note if you need to use your medicines more often than usual.  Do not use any products that contain nicotine or tobacco, such as cigarettes and e-cigarettes. If you need help quitting, ask your doctor.  Stay away from secondhand smoke.  Avoid doing things outdoors when allergen counts are high and when air quality is low.  Wear a ski mask when doing outdoor activities in the winter. The mask should cover your nose and mouth. Exercise indoors on cold days if you can.  Warm up  before you exercise. Take time to cool down after exercise.  Use a peak flow meter as told by your doctor. A peak flow meter is a tool that measures how well the lungs are working.  Keep track of the peak flow meter's readings. Write them down.  Follow your asthma action plan. This is a written plan for taking care of your asthma and treating your attacks.  Make sure you get all the shots (vaccines) that your doctor recommends. Ask your doctor about a flu shot and a pneumonia shot.  Keep all follow-up visits as told by your doctor. This is important. Contact a doctor if:  You have wheezing, shortness of breath, or a cough even while taking medicine to prevent attacks.  The mucus you cough up (sputum) is thicker than usual.  The mucus you cough up changes from clear or white to yellow, green, gray, or bloody.  You have problems from the medicine you are taking, such as: ? A rash. ? Itching. ? Swelling. ? Trouble breathing.  You need reliever medicines more than 2-3 times a week.  Your peak flow reading is still at 50-79% of your personal best after following the action plan for 1 hour.  You have a fever. Get help right away if:  You seem to be worse and are not responding to medicine during an asthma attack.  You are short of breath even at rest.  You get short of breath when doing very little activity.  You have trouble eating, drinking, or talking.  You have chest pain or tightness.  You have a fast heartbeat.  Your lips or fingernails start to turn blue.  You are light-headed or dizzy, or you faint.  Your peak flow is less than 50% of your personal best.  You feel too tired to breathe normally. Summary  Asthma is a long-term (chronic) condition in which the airways get tight and narrow. An asthma attack can make it hard to breathe.  Asthma cannot be cured, but medicines and lifestyle changes can help control it.  Make sure you understand how to avoid  triggers and how and when to use your medicines. This information is not intended to replace advice given to you by your health care provider. Make sure you discuss any questions you have with your health care provider. Document Revised: 08/19/2019 Document Reviewed: 08/19/2019 Elsevier Patient Education  2021 [employer]   Exercise-Induced Bronchoconstriction, Adult  Exercise-induced bronchospasm (EIB) happens when the airways narrow during or after vigorous activity or exercise. The airways are the passages that lead from the nose and mouth down into the lungs. When the airways narrow, this can cause coughing, wheezing, and shortness of breath. This makes it hard to breathe. Anyone can develop EIB, even people who do not have allergies or asthma. With proper treatment, most people with EIB can be active and exercise normally. What are the causes? The exact cause of this condition is not known. Symptoms are brought on (triggered) by physical activity. EIB can also be triggered by:  Breathing very cold and dry or hot and humid air.  Chemicals, such as chlorine in swimming pools, pesticides, or fertilizers.  Outdoor triggers, such as: Dentist? Air pollution. ? Car exhaust. ? Pollen from grass, trees, or flowers. ? Campfire smoke.  Indoor triggers, such as: ? Dust. ? Mold. ? Tobacco smoke. ? Cleaning solutions. ? Animal dander. What increases the risk? You are more likely to develop this condition if you:  Have asthma.  Participate in sports that require constant motion, such as basketball, hockey, skiing, and swimming.  Work outdoors.  Exercise where there are higher levels of one or more EIB triggers. What are the signs or symptoms? Symptoms of this condition include:  Coughing.  Wheezing.  Shortness of breath.  Chest pain or tightness.  Sore throat.  Upset stomach. Symptoms may worsen after exercise has stopped. How is this diagnosed? EIB is diagnosed with your  medical history and a physical exam. You may also have other tests, including:  Lung function studies (spirometry).  An exercise test to check for EIB symptoms.  Allergy tests. How is this treated? Treatment for EIB includes preventing symptoms when possible, and treating EIB quickly when symptoms occur. Treatment may include:  Taking medicine that your health care provider prescribes. Medicine comes in different forms, including: ? Medicines that you breathe in (inhale). These include:  Steroids. These help to control your symptoms and are usually taken every day.  Quick relief medicines. These help to quickly relieve your breathing difficulty. ? Medicines that you take by mouth (orally). These help to control allergies and asthma.  Avoiding triggers.  Stopping physical activity or exercise to rest.   Follow these instructions at home:  Take over-the-counter and prescription medicines only as told by your health care provider.  Do not use products that contain nicotine or tobacco, such as cigarettes, e-cigarettes, and chewing tobacco. If you need help quitting, ask your health care provider.  Make changes in your workout as told by your health care provider. Exercise is important to your health and well-being. ? Keep quick relief medicine with you when you are exercising. ? Tell your exercise partners about your condition. Wear a medical ID bracelet. ? If you are planning to exercise alone or in an isolated area, let someone know where you are going and when you will be back.  You may need to see a health care provider who specializes in allergies (allergist) or the lungs (pulmonologist) for more tests.  Keep all follow-up visits as told by your health care provider. This is important. How is this prevented?  Take medicines to prevent exercise-induced bronchospasm as told by your health care provider.  Warm up before starting to play sports or exercise.  Exercise indoors to  avoid outdoor triggers.  Cover your nose and mouth with a scarf to warm air that is very cold.  Tell your workout partners or trainer about your condition. Tell them how to help you if you have an episode. Contact a health care provider if:  You have coughing, wheezing, or shortness of breath that continues after treatment.  Your coughing wakes you up at night.  You have less endurance than you used to. Get help right away if:  Your medicine is not helping you breathe better.  You cannot catch your breath.  You pass out. Summary  Exercise-induced bronchospasm (EIB) happens when the airways narrow during or after exercise.  When the airways narrow, this can cause coughing, wheezing, and shortness of breath.  It can be difficult to breathe.  Take over-the-counter and prescription medicines only as told by your health care provider.  Make changes in your workout as told by your health care provider.  Contact a health care provider if you continue to have trouble breathing after treatment. This information is not intended to replace advice given to you by your health care provider. Make sure you discuss any questions you have with your health care provider. Document Revised: 01/02/2018 Document Reviewed: 01/07/2018 Elsevier Patient Education  2021 [employer].

## 2020-06-20 NOTE — Progress Notes (Signed)
Name: Darren Kim   MRN: [id number]    DOB: 15-Jul-1990   Date:06/20/2020       Progress Note  Chief Complaint  Patient presents with  . Asthma    Follow up     Subjective:   Darren Kim is a 30 y.o. male, presents to clinic for routine f/up  Pt new to me, hx of moderate persistant asthma and anxiety/depression  Depression screen [facility] 2/9 06/20/2020 03/08/2020 01/21/2020  Decreased Interest 2 0 0  Down, Depressed, Hopeless 0 0 0  PHQ - 2 Score 2 0 0  Altered sleeping 3 - -  Tired, decreased energy 1 - -  Change in appetite 1 - -  Feeling bad or failure about yourself  0 - -  Trouble concentrating 0 - -  Moving slowly or fidgety/restless 0 - -  Suicidal thoughts 0 - -  PHQ-9 Score 7 - -  Difficult doing work/chores - - -   GAD 7 : Generalized Anxiety Score 06/20/2020 05/26/2019  Nervous, Anxious, on Edge 0 0  Control/stop worrying 2 2  Worry too much - different things 2 2  Trouble relaxing 1 2  Restless 1 0  Easily annoyed or irritable 2 3  Afraid - awful might happen 0 0  Total GAD 7 Score 8 9  Anxiety Difficulty - Not difficult at all  not on wellbutrin-he does not know if he ever started that medication  Asthma -  Currently on medications and severely uncontrolled  06/20/20 1055  Asthma History  Symptoms Throughout the day  Nighttime Awakenings >1/wk but not nightly  Asthma interference with normal activity Some limitations  SABA use (not for EIB) Several times/day  Risk: Exacerbations requiring oral systemic steroids 2 or more / year  Asthma Severity Moderate Persistent   He endorses a history of several ER visits for exacerbations He is currently using rescue inhaler very often and doing DuoNebs 3 times a day This has become more of a problem since he ran out of his Symbicort inhaler and he was unable to afford the Trelegy inhaler On his chart there is several other inhaler medications and allergy control medications but he is also out of most of  these as well He reports previously going to pulmonary and allergy specialist but was unable to afford some of the further work-up testing or proposed treatments  He did feel that Trelegy worked its just very expensive If he cannot afford Trelegy he would like to get back on Symbicort  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  albuterol (VENTOLIN HFA) 108 (90 Base) MCG/ACT inhaler, Inhale 2 puffs into the lungs every 6 (six) hours as needed for wheezing or shortness of breath., Disp: 18 g, Rfl: 3 .  fluticasone (FLONASE) 50 MCG/ACT nasal spray, Place 2 sprays into both nostrils daily., Disp: 16 g, Rfl: 6 .  ipratropium-albuterol (DUONEB) 0.5-2.5 (3) MG/3ML SOLN, Take 3 mLs by nebulization every 4 (four) hours as needed., Disp: 360 mL, Rfl: 3 .  budesonide-formoterol (SYMBICORT) 80-4.5 MCG/ACT inhaler, SMARTSIG:2 Puff(s) By Mouth Twice Daily (Patient not taking: Reported on 06/20/2020), Disp: , Rfl:  .  buPROPion (WELLBUTRIN XL) 150 MG 24 hr tablet, Take 1 tablet (150 mg total) by mouth daily. (Patient not taking: No sig reported), Disp: 90 tablet, Rfl: 1 .  Fluticasone Furoate (ARNUITY ELLIPTA) 100 MCG/ACT AEPB, Inhale into the lungs. (Patient not taking: Reported on 06/20/2020), Disp: , Rfl:  .  Fluticasone-Umeclidin-Vilant (TRELEGY ELLIPTA) 200-62.5-25 MCG/INH AEPB, Inhale into the lungs. (Patient not taking: Reported on 06/20/2020), Disp: , Rfl:  .  montelukast (SINGULAIR) 10 MG tablet, Take 1 tablet (10 mg total) by mouth at bedtime. (Patient not taking: Reported on 06/20/2020), Disp: 90 tablet, Rfl: 1 .  predniSONE (DELTASONE) 10 MG tablet, Take four tabs daily x 2 days, then three tabs X 2 days, then two tabs daily X 2 days, then one tab daily x 2 days (Patient not taking: Reported on 06/20/2020), Disp: 20 tablet, Rfl: 0 .   predniSONE (DELTASONE) 10 MG tablet, Take four tabs daily X 2 days, then two tabs daily X 2 days, then one tab daily X 2 days (Patient not taking: Reported on 06/20/2020), Disp: 14 tablet, Rfl: 0  Patient Active Problem List   Diagnosis Date Noted  . COVID-19 03/08/2020  . Excessive daytime sleepiness 05/26/2019  . Dysthymia 05/26/2019  . Moderate persistent asthma with (acute) exacerbation 11/27/2018  . Class 2 obesity due to excess calories without serious comorbidity with body mass index (BMI) of 37.0 to 37.9 in adult 11/27/2018    History reviewed. No pertinent surgical history.  History reviewed. No pertinent family history.  Social History   Tobacco Use  . Smoking status: Never Smoker  . Smokeless tobacco: Never Used  Vaping Use  . Vaping Use: Never used  Substance Use Topics  . Alcohol use: Not Currently  . Drug use: Yes    Types: Marijuana     No Known Allergies  Health Maintenance  Topic Date Due  . TETANUS/TDAP  Never done  . COVID-19 Vaccine (1) 07/06/2020 (Originally 04/15/1998)  . INFLUENZA VACCINE  07/28/2020 (Originally 11/29/2019)  . Hepatitis C Screening  Completed  . HIV Screening  Completed    Chart Review Today: I personally reviewed active problem list, medication list, allergies, family history, social history, health maintenance, notes from last encounter, lab results, imaging with the patient/caregiver today.   Review of Systems  Constitutional: Negative.   HENT: Negative.   Eyes: Negative.   Respiratory: Negative.   Cardiovascular: Negative.   Gastrointestinal: Negative.   Endocrine: Negative.   Genitourinary: Negative.   Musculoskeletal: Negative.   Skin: Negative.   Allergic/Immunologic: Negative.   Neurological: Negative.   Hematological: Negative.   Psychiatric/Behavioral: Negative.   All other systems reviewed and are negative.    Objective:   Vitals:   06/20/20 1023  BP: 124/78  Pulse: 98  Resp: 18  Temp: 98 F (36.7 C)   TempSrc: Oral  SpO2: 98%  Weight: 239 lb 1.6 oz (108.5 kg)  Height: 5\' 6"  (1.676 m)    Body mass index is 38.59 kg/m.  Physical Exam Vitals and nursing note reviewed.  Constitutional:      General: He is not in acute distress.    Appearance: Normal appearance. He is well-developed. He is obese. He is not ill-appearing, toxic-appearing or diaphoretic.     Interventions: Face mask in place.  HENT:     Head: Normocephalic and atraumatic.     Jaw: No trismus.     Right Ear: External ear normal.     Left Ear: External ear normal.  Eyes:     General: Lids are normal. No scleral icterus.       Right eye: No discharge.        Left eye:  No discharge.     Conjunctiva/sclera: Conjunctivae normal.  Neck:     Trachea: Trachea and phonation normal. No tracheal deviation.  Cardiovascular:     Rate and Rhythm: Normal rate and regular rhythm.     Pulses: Normal pulses.          Radial pulses are 2+ on the right side and 2+ on the left side.       Posterior tibial pulses are 2+ on the right side and 2+ on the left side.     Heart sounds: Normal heart sounds. No murmur heard. No friction rub. No gallop.   Pulmonary:     Effort: Pulmonary effort is normal. No tachypnea, accessory muscle usage, respiratory distress or retractions.     Breath sounds: No stridor. Wheezing (diffuse inspiratory and exp wheeze) present. No rhonchi or rales.  Abdominal:     General: Bowel sounds are normal. There is no distension.     Palpations: Abdomen is soft.  Musculoskeletal:     Right lower leg: No edema.     Left lower leg: No edema.  Skin:    General: Skin is warm and dry.     Coloration: Skin is not jaundiced.     Findings: No rash.     Nails: There is no clubbing.  Neurological:     Mental Status: He is alert. Mental status is at baseline.     Cranial Nerves: No dysarthria or facial asymmetry.     Motor: No tremor or abnormal muscle tone.     Gait: Gait normal.  Psychiatric:        Mood and  Affect: Mood normal.        Speech: Speech normal.        Behavior: Behavior normal. Behavior is cooperative.         Assessment & Plan:     ICD-10-CM   1. Moderate persistent asthma with acute exacerbation  J45.41 albuterol (VENTOLIN HFA) 108 (90 Base) MCG/ACT inhaler    montelukast (SINGULAIR) 10 MG tablet    predniSONE (DELTASONE) 20 MG tablet   uncontrolled, prednisone, restart singulair and maintenence inhaler, uses nebs or albuterol rescue inhaler q 4h prn, close f/up   2. Exercise induced bronchospasm  J45.990 albuterol (VENTOLIN HFA) 108 (90 Base) MCG/ACT inhaler   explained use of albuterol inhaler for EIB - encouraged him to use prior to exercise, prescriptions and chart changed to reflect this  3. Class 2 obesity with body mass index (BMI) of 38.0 to 38.9 in adult, unspecified obesity type, unspecified whether serious comorbidity present  E66.9    Z68.38    weight up, he's trying to work out, will address more at subsequent visits once asthma is controlled  4. Dysthymia  F34.1    mood ok w/o meds, not on wellbutrin, reviewed PHQ and GAD 7   5. Excessive daytime sleepiness  G47.19    did consult with specialists but was not able to afford further work up or equipment   Pt given coupons for trelegy - if he cannot afford he was asked to notify us so we could send in symbicort refills instead  Educated pt on asthma and EIB today and different uses and dosing for albuterol inhaler  Close f/up   Return for 1-2 month asthma recheck.   Danelle Berry, PA-C 06/20/20 10:29 AM

## 2020-08-08 ENCOUNTER — Ambulatory Visit: Payer: 59 | Admitting: Family Medicine

## 2020-08-08 DIAGNOSIS — J4541 Moderate persistent asthma with (acute) exacerbation: Secondary | ICD-10-CM

## 2020-08-10 IMAGING — CR DG CHEST 2V
1 series · 2 of 2 positions shown · non-contrast
Comparison: 05/10/2017

CLINICAL DATA: Dyspnea

EXAM:
CHEST - 2 VIEW

[Series 1: dg chest 2 view · 0.14mm/px · 2 of 2 slices shown]
[im 1/2]
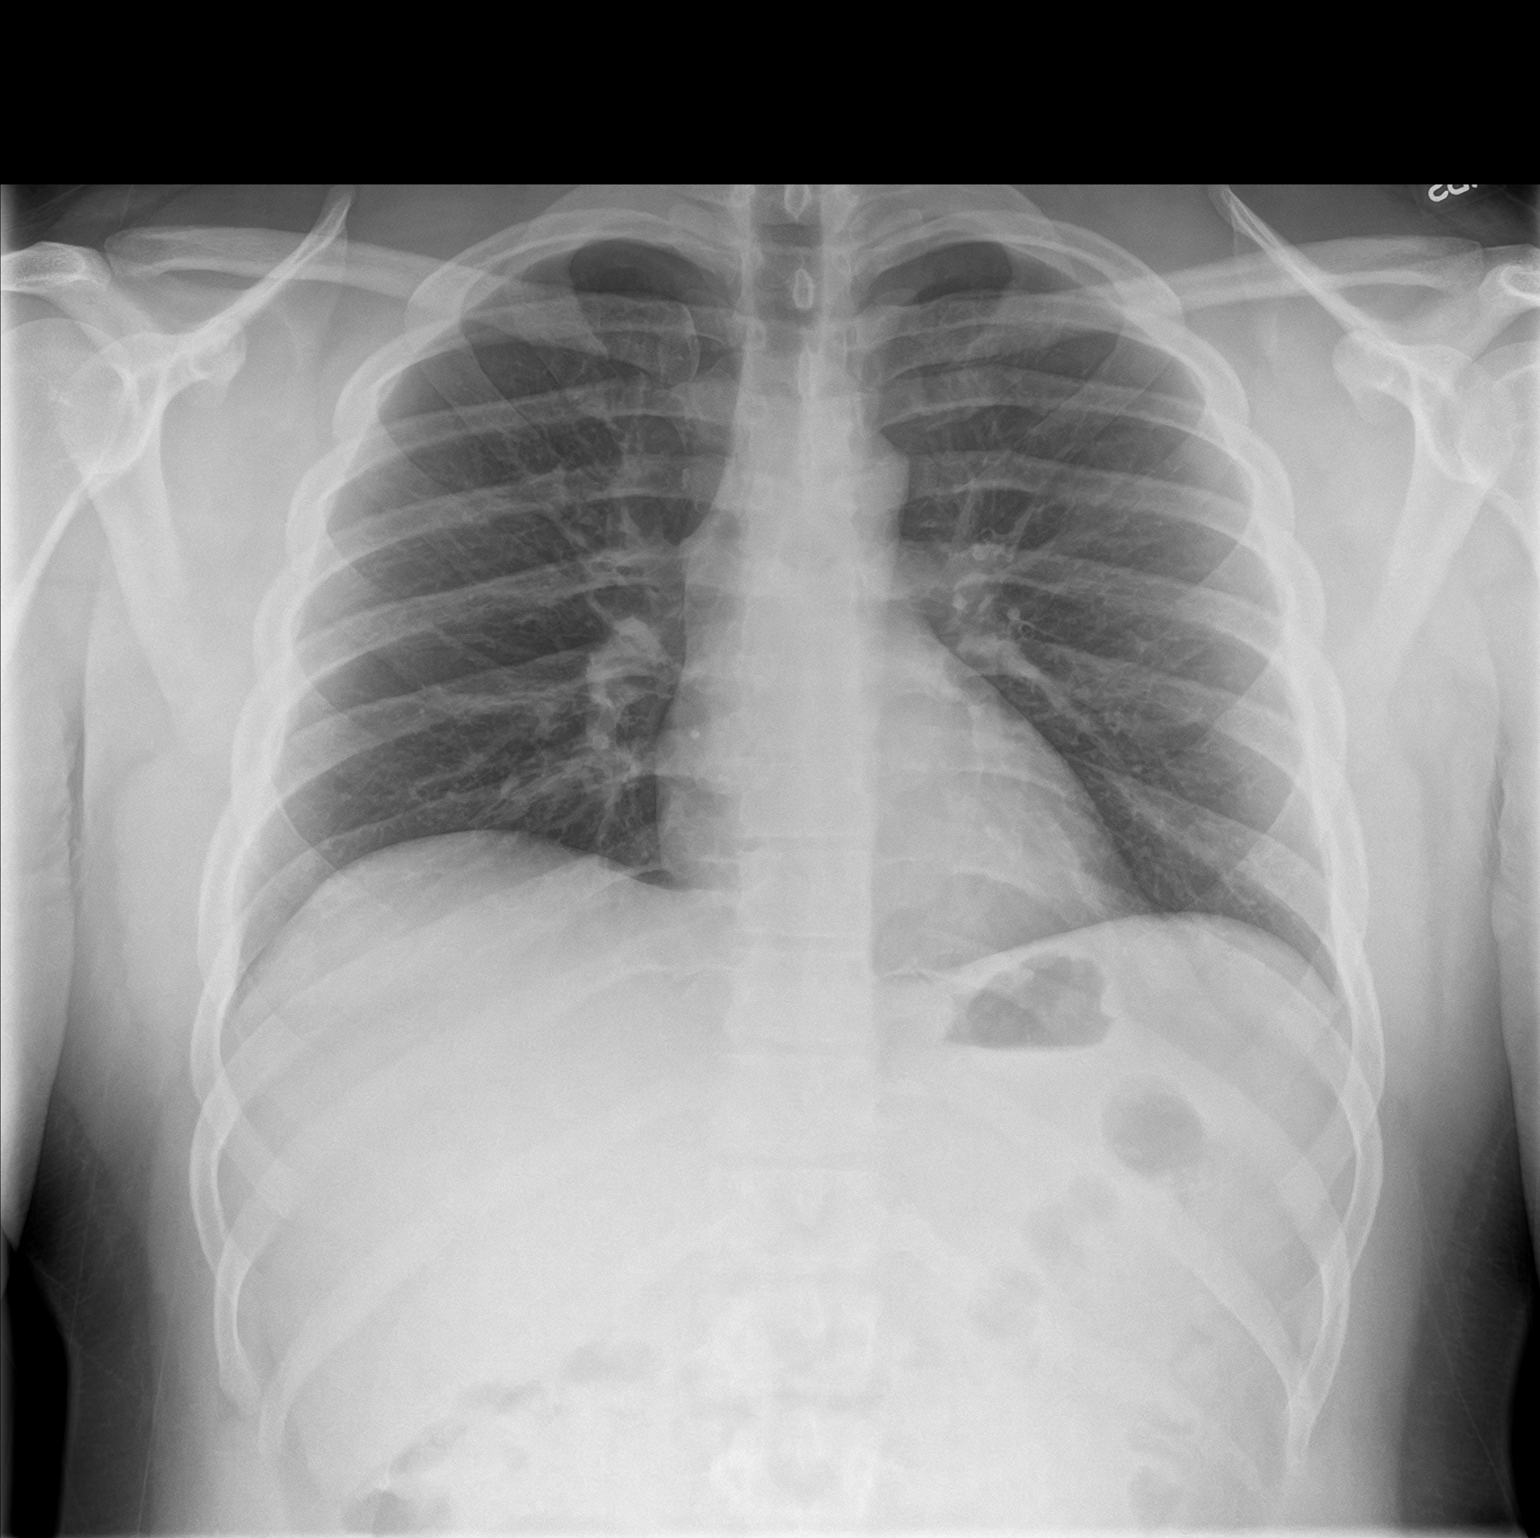
[im 2/2]
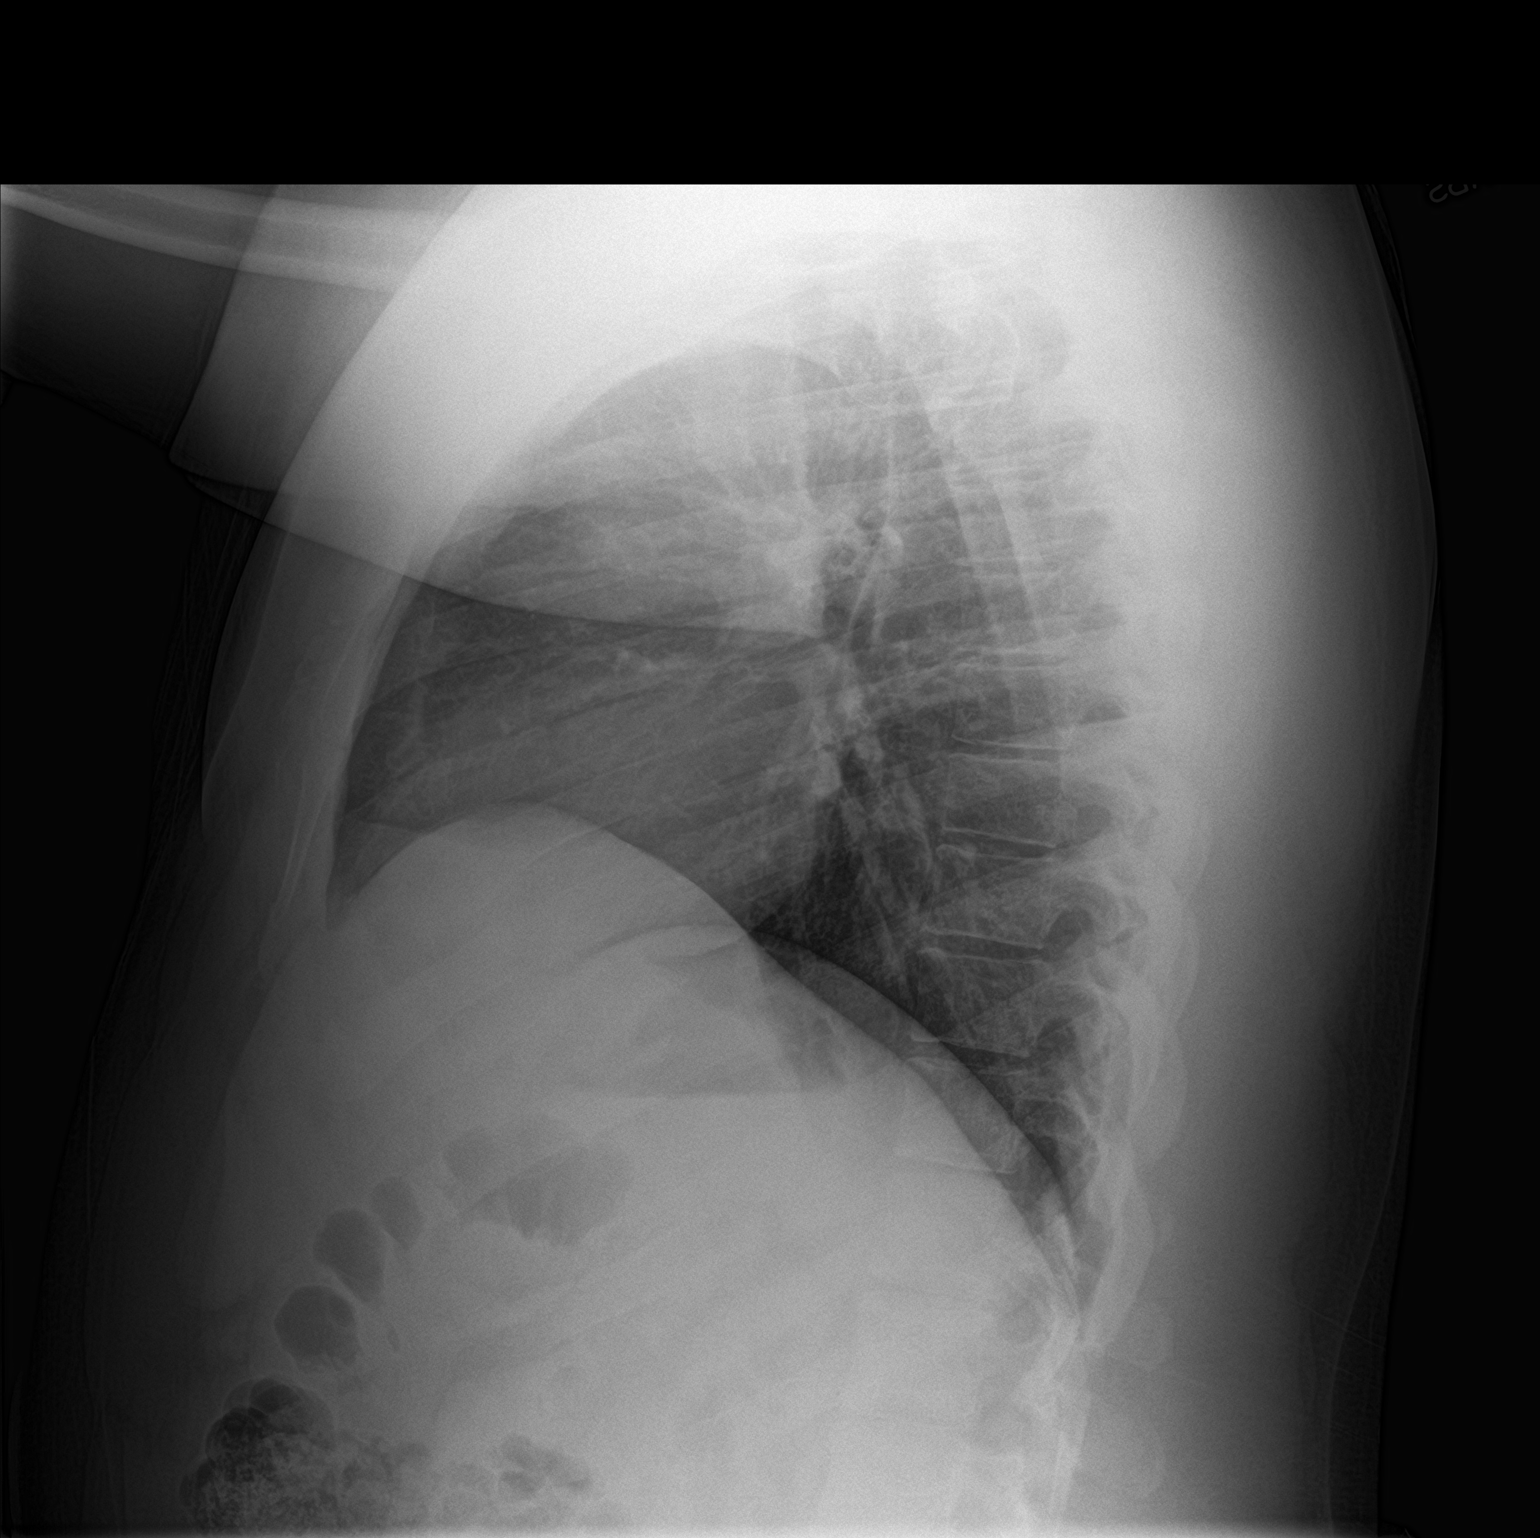

[2 of 2 positions shown; findings below may reference images not displayed]

FINDINGS: The heart size and mediastinal contours are within normal limits.
Both lungs are clear. The visualized skeletal structures are
unremarkable.
IMPRESSION: No active cardiopulmonary disease.

## 2020-08-26 ENCOUNTER — Encounter: Payer: Self-pay | Admitting: Family Medicine

## 2020-08-26 ENCOUNTER — Telehealth: Payer: Self-pay | Admitting: Family Medicine

## 2020-08-26 ENCOUNTER — Ambulatory Visit (INDEPENDENT_AMBULATORY_CARE_PROVIDER_SITE_OTHER): Payer: 59 | Admitting: Family Medicine

## 2020-08-26 ENCOUNTER — Other Ambulatory Visit: Payer: Self-pay

## 2020-08-26 VITALS — BP 132/84 | HR 96 | Temp 98.2°F | Resp 16 | Ht 66.0 in | Wt 238.0 lb

## 2020-08-26 DIAGNOSIS — J4541 Moderate persistent asthma with (acute) exacerbation: Secondary | ICD-10-CM | POA: Diagnosis not present

## 2020-08-26 DIAGNOSIS — J301 Allergic rhinitis due to pollen: Secondary | ICD-10-CM

## 2020-08-26 MED ORDER — TRELEGY ELLIPTA 200-62.5-25 MCG/INH IN AEPB
1.0000 | INHALATION_SPRAY | Freq: Every day | RESPIRATORY_TRACT | 5 refills | Status: DC
Start: 2020-08-26 — End: 2020-09-06

## 2020-08-26 MED ORDER — PREDNISONE 10 MG PO TABS: 10.0000 mg | ORAL_TABLET | Freq: Two times a day (BID) | ORAL | 0 refills | Status: DC

## 2020-08-26 MED ORDER — LEVOCETIRIZINE DIHYDROCHLORIDE 5 MG PO TABS: 5.0000 mg | ORAL_TABLET | Freq: Every evening | ORAL | 1 refills | Status: DC

## 2020-08-26 MED ORDER — AZELASTINE HCL 0.1 % NA SOLN: 1.0000 | Freq: Two times a day (BID) | NASAL | 2 refills | Status: DC

## 2020-08-26 NOTE — Telephone Encounter (Signed)
Spoke with patient an informed him per Dr. Carlynn Purl to start the prednisone that she sent into his pharmacy and to find out what his insurance will cover in place of the trellegy.  Pt stated that his insurance will cover the trellegy but due to his deductible the out of pocket expense is still high.  The coupon that our office gave will only knock off $200 dollars.  I informed patient that we will contact our pharm. Rep to see if there is another coupon that can be used.  Pt verbalized understanding.

## 2020-08-26 NOTE — Progress Notes (Signed)
Name: Darren Kim   MRN: [id number]    DOB: May 30, 1990   Date:08/26/2020       Progress Note  Subjective  Chief Complaint  Medication Refill  HPI  Asthma Moderate persistent: he was seen by Danelle Berry 05/2020 with SOB and wheezing. He was given Trelegy and prednisone taper , he states he did well while on the medication but since out he is getting progressively worse. He has been coughing all day and is worse at night, having wheezing and SOB with activity, he has nasal congestion and rhinorrhea. He has been taking singulair at night and using albuterol frequently to control symptoms. No fever, chills.   Patient Active Problem List   Diagnosis Date Noted  . Exercise induced bronchospasm 06/20/2020  . COVID-19 03/08/2020  . Excessive daytime sleepiness 05/26/2019  . Dysthymia 05/26/2019  . Moderate persistent asthma with acute exacerbation 11/27/2018  . Class 2 obesity with body mass index (BMI) of 38.0 to 38.9 in adult 11/27/2018    No past surgical history on file.  No family history on file.  Social History   Tobacco Use  . Smoking status: Never Smoker  . Smokeless tobacco: Never Used  Substance Use Topics  . Alcohol use: Not Currently     Current Outpatient Medications:  .  albuterol (VENTOLIN HFA) 108 (90 Base) MCG/ACT inhaler, 2 puffs every 4 to 6 hours as needed for asthma sx, and use 2 puffs prior to exercise for EIB, Disp: 18 g, Rfl: 5 .  azelastine (ASTELIN) 0.1 % nasal spray, Place 1 spray into both nostrils 2 (two) times daily. Use in each nostril as directed, Disp: 30 mL, Rfl: 2 .  fluticasone (FLONASE) 50 MCG/ACT nasal spray, Place 2 sprays into both nostrils daily., Disp: 16 g, Rfl: 6 .  ipratropium-albuterol (DUONEB) 0.5-2.5 (3) MG/3ML SOLN, Take 3 mLs by nebulization every 4 (four) hours as needed., Disp: 360 mL, Rfl: 3 .  levocetirizine (XYZAL) 5 MG tablet, Take 1 tablet (5 mg total) by mouth every evening., Disp: 90 tablet, Rfl: 1 .  montelukast  (SINGULAIR) 10 MG tablet, Take 1 tablet (10 mg total) by mouth at bedtime., Disp: 90 tablet, Rfl: 1 .  Fluticasone-Umeclidin-Vilant (TRELEGY ELLIPTA) 200-62.5-25 MCG/INH AEPB, Inhale 1 puff into the lungs daily., Disp: 60 each, Rfl: 5  No Known Allergies  I personally reviewed active problem list, medication list, allergies, family history, social history, health maintenance with the patient/caregiver today.   ROS  Ten systems reviewed and is negative except as mentioned in HPI   Objective  Vitals:   08/26/20 1123  BP: 132/84  Pulse: 96  Resp: 16  Temp: 98.2 F (36.8 C)  TempSrc: Oral  SpO2: 97%  Weight: 238 lb (108 kg)  Height: 5\' 6"  (1.676 m)    Body mass index is 38.41 kg/m.  Physical Exam  Constitutional: Patient appears well-developed and well-nourished. Obese  No distress.  HEENT: head atraumatic, normocephalic, pupils equal and reactive to light,  neck supple, boggy turbinates  Cardiovascular: Normal rate, regular rhythm and normal heart sounds.  No murmur heard. No BLE edema. Pulmonary/Chest: Effort normal , in and expiratory wheezing.  No respiratory distress. Abdominal: Soft.  There is no tenderness. Psychiatric: Patient has a normal mood and affect. behavior is normal. Judgment and thought content normal.  PHQ2/9: Depression screen [facility] 2/9 08/26/2020 06/20/2020 03/08/2020 01/21/2020 01/21/2020  Decreased Interest 2 2 0 0 0  Down, Depressed, Hopeless 0 0 0 0 0  PHQ -  2 Score 2 2 0 0 0  Altered sleeping 3 3 - - -  Tired, decreased energy 3 1 - - -  Change in appetite 2 1 - - -  Feeling bad or failure about yourself  0 0 - - -  Trouble concentrating 0 0 - - -  Moving slowly or fidgety/restless 0 0 - - -  Suicidal thoughts 0 0 - - -  PHQ-9 Score 10 7 - - -  Difficult doing work/chores - - - - -    phq 9 is positive   Fall Risk: Fall Risk  08/26/2020 06/20/2020 03/08/2020 01/21/2020 01/21/2020  Falls in the past year? 0 0 0 0 0  Number falls in past yr: 0 0 0 0  0  Injury with Fall? 0 0 0 0 0  Follow up - Falls evaluation completed - Falls evaluation completed Falls evaluation completed     Functional Status Survey: Is the patient deaf or have difficulty hearing?: No Does the patient have difficulty seeing, even when wearing glasses/contacts?: No Does the patient have difficulty concentrating, remembering, or making decisions?: No Does the patient have difficulty walking or climbing stairs?: No Does the patient have difficulty dressing or bathing?: No Does the patient have difficulty doing errands alone such as visiting a doctor's office or shopping?: No   Assessment & Plan  1. Moderate persistent asthma with (acute) exacerbation  - Fluticasone-Umeclidin-Vilant (TRELEGY ELLIPTA) 200-62.5-25 MCG/INH AEPB; Inhale 1 puff into the lungs daily.  Dispense: 60 each; Refill: 5 - predniSONE (DELTASONE) 10 MG tablet; Take 1 tablet (10 mg total) by mouth 2 (two) times daily with a meal.  Dispense: 10 tablet; Refill: 0  2. Seasonal allergic rhinitis due to pollen  - levocetirizine (XYZAL) 5 MG tablet; Take 1 tablet (5 mg total) by mouth every evening.  Dispense: 90 tablet; Refill: 1 - azelastine (ASTELIN) 0.1 % nasal spray; Place 1 spray into both nostrils 2 (two) times daily. Use in each nostril as directed  Dispense: 30 mL; Refill: 2

## 2020-08-26 NOTE — Telephone Encounter (Signed)
Pt calling in stating that he went to the pharmacy to pick up the trellegy, but even with the coupon the medication is still $400. He is requesting to have something else sent in for him. Please advise.    [id number] [place], [place] - [address]  [place] [address]  Phone: [phone] Fax: [phone]  Hours: Not open 24 hours

## 2020-09-01 ENCOUNTER — Telehealth: Payer: Self-pay

## 2020-09-01 NOTE — Telephone Encounter (Signed)
Copied from CRM [id number]. Topic: General - Other >> Sep 01, 2020  3:55 PM Pawlus, Maxine Glenn A wrote: Reason for CRM: Pt stated he was unable to pick up Fluticasone-Umeclidin-Vilant (TRELEGY ELLIPTA) 200-62.5-25 MCG/INH AEPB, wanted to know if there was an alternative or generic option available.

## 2020-09-02 NOTE — Telephone Encounter (Signed)
Pt notified, to contact ins or pharmacy to see what is covered or he can afford

## 2020-09-06 ENCOUNTER — Other Ambulatory Visit: Payer: Self-pay | Admitting: Family Medicine

## 2020-09-06 ENCOUNTER — Telehealth: Payer: Self-pay | Admitting: Family Medicine

## 2020-09-06 MED ORDER — ARNUITY ELLIPTA 100 MCG/ACT IN AEPB: 1.0000 | INHALATION_SPRAY | Freq: Every day | RESPIRATORY_TRACT | 2 refills | Status: DC

## 2020-09-06 NOTE — Telephone Encounter (Signed)
Pt called and is requesting an alternative medication. He wants to know if PCP will prescribe him Arnuity Ellipta. Please advise  Best contact: [phone]

## 2020-09-06 NOTE — Telephone Encounter (Signed)
Pt said that the medication that the doctor gave him to get cost way to much for hi and they told him the only way they could suggest another medication that may be a little cheaper would mean that he would need to change his policy and he can not do that. A lung doctor a year or so ago suggested ARNUITY ELLIPTA.Marland Kitchen Could we do this medication instead?

## 2020-09-07 NOTE — Telephone Encounter (Signed)
Can you find out what the cost was for trelegy?  And what is the amount he is able to afford? Dr. Carlynn Purl saw him last and changed his med, I don't know if he was given coupons?   We may be able to reach out to the Drug rep brenda for coupons and samples Please let me know the above so I know more info and how to help him

## 2020-09-07 NOTE — Telephone Encounter (Signed)
Please advise 

## 2020-09-08 NOTE — Telephone Encounter (Signed)
Spoke to patient and he stated the coupon gives $500 off for 3 months and the medication was $799 and $200 off after that.

## 2020-09-08 NOTE — Telephone Encounter (Signed)
See prior comments please and advise?

## 2020-09-09 NOTE — Telephone Encounter (Signed)
Spoke to patient and he stated he had saw a pulmonology before and was told [employer] did not work as good as [employer]. Patient stated he has about 10 days left on his inhaler now. He was informed that we would discuss with drug rep about coupon and call him back

## 2020-09-27 ENCOUNTER — Ambulatory Visit: Payer: 59 | Admitting: Family Medicine

## 2020-10-04 ENCOUNTER — Other Ambulatory Visit: Payer: Self-pay

## 2020-10-04 ENCOUNTER — Encounter: Payer: Self-pay | Admitting: Unknown Physician Specialty

## 2020-10-04 ENCOUNTER — Ambulatory Visit (INDEPENDENT_AMBULATORY_CARE_PROVIDER_SITE_OTHER): Payer: 59 | Admitting: Unknown Physician Specialty

## 2020-10-04 VITALS — BP 120/80 | HR 88 | Temp 98.9°F | Resp 18 | Ht 66.0 in | Wt 239.2 lb

## 2020-10-04 DIAGNOSIS — J4540 Moderate persistent asthma, uncomplicated: Secondary | ICD-10-CM | POA: Insufficient documentation

## 2020-10-04 DIAGNOSIS — J4541 Moderate persistent asthma with (acute) exacerbation: Secondary | ICD-10-CM

## 2020-10-04 MED ORDER — PREDNISONE 20 MG PO TABS: 40.0000 mg | ORAL_TABLET | Freq: Every day | ORAL | 0 refills | Status: DC

## 2020-10-04 MED ORDER — ARNUITY ELLIPTA 100 MCG/ACT IN AEPB: 1.0000 | INHALATION_SPRAY | Freq: Every day | RESPIRATORY_TRACT | 2 refills | Status: DC

## 2020-10-04 MED ORDER — MONTELUKAST SODIUM 10 MG PO TABS
10.0000 mg | ORAL_TABLET | Freq: Every day | ORAL | 3 refills | Status: DC
Start: 2020-10-04 — End: 2021-10-05

## 2020-10-04 NOTE — Progress Notes (Signed)
BP 120/80   Pulse 88   Temp 98.9 F (37.2 C)   Resp 18   Ht 5\' 6"  (1.676 m)   Wt 239 lb 3.2 oz (108.5 kg)   SpO2 98%   BMI 38.61 kg/m    Subjective:    Patient ID: Darren Kim, male    DOB: 1991/01/19, 30 y.o.   MRN: [id number]  HPI: Darren Kim is a 30 y.o. male  Chief Complaint  Patient presents with  . Allergic Rhinitis   . Asthma  . Medication Refill   Asthma Pt states he has tightness in his chest since this morning.  No nasal congestion, nausea, fever.  Not taking any of the inhalers.  Out of his Singulair and Ellipta.  Has Albuterol and not using.  Had trouble with affordability of medication and therefore on a cycle of poor asthma control with rescue inhalers not working.  Seems that only powdered based inhalers work well  Relevant past medical, surgical, family and social history reviewed and updated as indicated. Interim medical history since our last visit reviewed. Allergies and medications reviewed and updated.  Review of Systems  Per HPI unless specifically indicated above     Objective:    BP 120/80   Pulse 88   Temp 98.9 F (37.2 C)   Resp 18   Ht 5\' 6"  (1.676 m)   Wt 239 lb 3.2 oz (108.5 kg)   SpO2 98%   BMI 38.61 kg/m   Wt Readings from Last 3 Encounters:  10/04/20 239 lb 3.2 oz (108.5 kg)  08/26/20 238 lb (108 kg)  06/20/20 239 lb 1.6 oz (108.5 kg)    Physical Exam Constitutional:      General: He is not in acute distress.    Appearance: Normal appearance. He is well-developed.  HENT:     Head: Normocephalic and atraumatic.  Eyes:     General: Lids are normal. No scleral icterus.       Right eye: No discharge.        Left eye: No discharge.     Conjunctiva/sclera: Conjunctivae normal.  Neck:     Vascular: No carotid bruit or JVD.  Cardiovascular:     Rate and Rhythm: Normal rate and regular rhythm.     Heart sounds: Normal heart sounds.  Pulmonary:     Effort: Pulmonary effort is normal. No respiratory distress.      Comments: Decreased throughout Abdominal:     Palpations: There is no hepatomegaly or splenomegaly.  Musculoskeletal:        General: Normal range of motion.     Cervical back: Normal range of motion and neck supple.  Skin:    General: Skin is warm and dry.     Coloration: Skin is not pale.     Findings: No rash.  Neurological:     Mental Status: He is alert and oriented to person, place, and time.  Psychiatric:        Behavior: Behavior normal.        Thought Content: Thought content normal.        Judgment: Judgment normal.     Results for orders placed or performed in visit on 11/27/18  Lipid panel  Result Value Ref Range   Cholesterol 145 <200 mg/dL   HDL 42 > OR = 40 mg/dL   Triglycerides 88 06/22/20 mg/dL   LDL Cholesterol (Calc) 85 mg/dL (calc)   Total CHOL/HDL Ratio 3.5 <5.0 (calc)   Non-HDL  Cholesterol (Calc) 103 <130 mg/dL (calc)  COMPLETE METABOLIC PANEL WITH GFR  Result Value Ref Range   Glucose, Bld 99 65 - 99 mg/dL   BUN 21 7 - 25 mg/dL   Creat 2.54 (H) 2.70 - 1.35 mg/dL   GFR, Est Non African American 62 > OR = 60 mL/min/1.47m2   GFR, Est African American 72 > OR = 60 mL/min/1.76m2   BUN/Creatinine Ratio 14 6 - 22 (calc)   Sodium 141 135 - 146 mmol/L   Potassium 5.2 3.5 - 5.3 mmol/L   Chloride 109 98 - 110 mmol/L   CO2 18 (L) 20 - 32 mmol/L   Calcium 10.2 8.6 - 10.3 mg/dL   Total Protein 7.4 6.1 - 8.1 g/dL   Albumin 4.7 3.6 - 5.1 g/dL   Globulin 2.7 1.9 - 3.7 g/dL (calc)   AG Ratio 1.7 1.0 - 2.5 (calc)   Total Bilirubin 1.3 (H) 0.2 - 1.2 mg/dL   Alkaline phosphatase (APISO) 42 36 - 130 U/L   AST 33 10 - 40 U/L   ALT 53 (H) 9 - 46 U/L  RPR  Result Value Ref Range   RPR Ser Ql NON-REACTIVE NON-REACTI  HIV Antibody (routine testing w rflx)  Result Value Ref Range   HIV 1&2 Ab, 4th Generation NON-REACTIVE NON-REACTI  Hepatitis C antibody  Result Value Ref Range   Hepatitis C Ab NON-REACTIVE NON-REACTI   SIGNAL TO CUT-OFF 0.08 <1.00  Urine cytology  ancillary only  Result Value Ref Range   Chlamydia Negative    Neisseria Gonorrhea Negative       Assessment & Plan:   Problem List Items Addressed This Visit      Unprioritized   Asthma, moderate persistent, poorly-controlled - Primary    Poor control of asthma as lacks inhaler affordability.  Will give samples of Trellegy and will rx the arnuitty when that runs out and he will check the cost.  Refer to pharmacist to help with medication management.  Rx for Prednisone today.        Relevant Medications   Fluticasone Furoate (ARNUITY ELLIPTA) 100 MCG/ACT AEPB   predniSONE (DELTASONE) 20 MG tablet   montelukast (SINGULAIR) 10 MG tablet   Other Relevant Orders   AMB Referral to [facility] Coordin[facility]   Moderate persistent asthma with acute exacerbation   Relevant Medications   Fluticasone Furoate (ARNUITY ELLIPTA) 100 MCG/ACT AEPB   predniSONE (DELTASONE) 20 MG tablet   montelukast (SINGULAIR) 10 MG tablet       Follow up plan: Return in about 4 weeks (around 11/01/2020) for and with pharmacist.

## 2020-10-04 NOTE — Assessment & Plan Note (Signed)
Poor control of asthma as lacks inhaler affordability.  Will give samples of Trellegy and will rx the arnuitty when that runs out and he will check the cost.  Refer to pharmacist to help with medication management.  Rx for Prednisone today.

## 2020-10-07 ENCOUNTER — Telehealth: Payer: Self-pay | Admitting: *Deleted

## 2020-10-07 NOTE — Chronic Care Management (AMB) (Signed)
  Care Management   Outreach Note  10/07/2020 Name: Darren Kim MRN: [id number] DOB: 10-Jul-1990  Referred by: Danelle Berry, PA-C Reason for referral : Care Coordination (Initial outreach to schedule referral with PharmD was unsuccessful)   An unsuccessful telephone outreach was attempted today. The patient was referred to the case management team for assistance with care management and care coordination.   Follow Up Plan: The care management team will reach out to the patient again over the next 7 days. If patient returns call to provider office, please advise to call Embedded Care Management Care Guide Gwenevere Ghazi at [phone].  Gwenevere Ghazi  Care Guide, Embedded Care Coordination [facility] Management

## 2020-10-07 NOTE — Chronic Care Management (AMB) (Signed)
  [name]   Note  10/07/2020 Name: Darren Kim MRN: [id number] DOB: 1990/12/15  Darren Kim is a 30 y.o. year old male who is a primary care patient of Darren Kim, [place]. I reached out to Darren Kim by phone today in response to a referral sent by Darren Kim PCP, Darren Kim.  Darren Kim was given information about [name] services today including:  [name] services include personalized support from designated clinical staff supervised by his physician, including individualized plan of care and coordination with other care providers 24/7 contact phone numbers for assistance for urgent and routine care needs. The patient may stop [name] services at any time by phone call to the office staff.  Patient agreed to services and verbal consent obtained.   Follow up plan: Telephone appointment with [name] team member scheduled for:10/13/2020  Darren Kim, Darren Kim

## 2020-10-11 ENCOUNTER — Telehealth: Payer: Self-pay

## 2020-10-11 NOTE — Progress Notes (Signed)
    Chronic Care Management Pharmacy Assistant   Name: Darren Kim  MRN: [id number] DOB: 23-Sep-1990  Chart review for clinical  pharmacist on 10/12/2020.  Conditions to be addressed/monitored: Asthma, Bronchospasm, Dysthymia, class 2 obesity  Primary concerns for visit include: None ID   Recent office visits:  10/04/2020 Gabriel Cirri NP (PCP Office) AMB Referral to [facility] Coordinaton 08/26/2020 Dr.Sowles MD (PCP Office) No medication Change noted 06/20/2020 Danelle Berry PA-C (PCP) - No medication change noted  Recent consult visits:  09/06/2020 Gery Pray FNP  (Pulmonology) Restart a daily antihistamine - Claritin 10 mg po daily  Hospital visits:  None in previous 6 months  Unable to leave a voice message to do initial question prior to patient appointment on 10/12/2020 for CCM  with Angelena Sole the Clinical pharmacist.    Medications: Outpatient Encounter Medications as of 10/11/2020  Medication Sig   albuterol (VENTOLIN HFA) 108 (90 Base) MCG/ACT inhaler 2 puffs every 4 to 6 hours as needed for asthma sx, and use 2 puffs prior to exercise for EIB   azelastine (ASTELIN) 0.1 % nasal spray Place 1 spray into both nostrils 2 (two) times daily. Use in each nostril as directed   fluticasone (FLONASE) 50 MCG/ACT nasal spray Place 2 sprays into both nostrils daily.   Fluticasone Furoate (ARNUITY ELLIPTA) 100 MCG/ACT AEPB Inhale 1 puff into the lungs daily.   levocetirizine (XYZAL) 5 MG tablet Take 1 tablet (5 mg total) by mouth every evening.   montelukast (SINGULAIR) 10 MG tablet Take 1 tablet (10 mg total) by mouth at bedtime.   predniSONE (DELTASONE) 20 MG tablet Take 2 tablets (40 mg total) by mouth daily with breakfast.   No facility-administered encounter medications on file as of 10/11/2020.    Star Rating Drugs: None ID  Everlean Cherry Clinical Pharmacist Assistant [phone]

## 2020-10-13 ENCOUNTER — Ambulatory Visit: Payer: 59

## 2020-10-13 DIAGNOSIS — J4540 Moderate persistent asthma, uncomplicated: Secondary | ICD-10-CM

## 2020-10-13 NOTE — Progress Notes (Signed)
Chronic Care Management Pharmacy Note  10/13/2020 Name:  Darren Kim MRN:  [id number] DOB:  May 29, 1990  Summary: Patient with significant financial concerns regarding inhalers.  Recommendations/Changes made from today's visit: Will see if patient qualifies for patient assistance for Trelegy  Plan: CPP to reconsult as necessary.    Subjective: Darren Kim is an 30 y.o. year old male who is a primary patient of Darren Kim, [place].  The CCM team was consulted for assistance with disease management and care coordination needs.    Engaged with patient by telephone for initial visit in response to provider referral for pharmacy case management and/or care coordination services.   Consent to Services:  Patient is not eligible for CCM services, consulted to aid with patient assistance.   Patient Care Team: Darren Grana, PA-C as PCP - General (Family Medicine) Germaine Pomfret, [facility] (Pharmacist)  Recent office visits: 10/04/20: Patient presented to Kathrine Haddock, NP for Asthma. Prednisone 40 mg daily. Inhaler unaffordable.   Objective:  Lab Results  Component Value Date   CREATININE 1.53 (H) 11/27/2018   BUN 21 11/27/2018   GFRNONAA 62 11/27/2018   GFRAA 72 11/27/2018   NA 141 11/27/2018   K 5.2 11/27/2018   CALCIUM 10.2 11/27/2018   CO2 18 (L) 11/27/2018   GLUCOSE 99 11/27/2018    No results found for: HGBA1C, FRUCTOSAMINE, GFR, MICROALBUR  Last diabetic Eye exam: No results found for: HMDIABEYEEXA  Last diabetic Foot exam: No results found for: HMDIABFOOTEX   Lab Results  Component Value Date   CHOL 145 11/27/2018   HDL 42 11/27/2018   LDLCALC 85 11/27/2018   TRIG 88 11/27/2018   CHOLHDL 3.5 11/27/2018    Hepatic Function Latest Ref Rng & Units 11/27/2018 10/03/2016 04/25/2012  Total Protein 6.1 - 8.1 g/dL 7.4 7.5 7.7  Albumin 3.5 - 5.0 g/dL - 4.1 4.0  AST 10 - 40 U/L 33 36 43(H)  ALT 9 - 46 U/L 53(H) 45 92(H)  Alk Phosphatase 38 - 126 U/L - 43 67(L)   Total Bilirubin 0.2 - 1.2 mg/dL 1.3(H) 1.3(H) 0.9    No results found for: TSH, FREET4  CBC Latest Ref Rng & Units 10/03/2016 08/27/2012 04/25/2012  WBC 3.8 - 10.6 K/uL 10.2 5.5 6.6  Hemoglobin 13.0 - 18.0 g/dL 18.1(H) 15.3 15.6  Hematocrit 40.0 - 52.0 % 50.7 44.2 46.5  Platelets 150 - 440 K/uL 302 277 266    No results found for: VD25OH  Clinical ASCVD: No  The ASCVD Risk score Mikey Bussing DC Jr., et al., 2013) failed to calculate for the following reasons:   The 2013 ASCVD risk score is only valid for ages 45 to 32    Depression screen PHQ 2/9 10/04/2020 08/26/2020 06/20/2020  Decreased Interest _0 Down, Depressed, Hopeless 1 0 0  PHQ - 2 Score _1 Altered sleeping _2 Tired, decreased energy _3 Change in appetite 0 2 1  Feeling bad or failure about yourself  0 0 0  Trouble concentrating 0 0 0  Moving slowly or fidgety/restless 0 0 0  Suicidal thoughts 0 0 0  PHQ-9 Score _4 Difficult doing work/chores Not difficult at all - -    Social History   Tobacco Use  Smoking Status Former   Pack years: 0.00   Types: Cigarettes   Quit date: 04/26/2020   Years since quitting: 0.4  Smokeless Tobacco Never   BP  Readings from Last 3 Encounters:  10/04/20 120/80  08/26/20 132/84  06/20/20 124/78   Pulse Readings from Last 3 Encounters:  10/04/20 88  08/26/20 96  06/20/20 98   Wt Readings from Last 3 Encounters:  10/04/20 239 lb 3.2 oz (108.5 kg)  08/26/20 238 lb (108 kg)  06/20/20 239 lb 1.6 oz (108.5 kg)   BMI Readings from Last 3 Encounters:  10/04/20 38.61 kg/m  08/26/20 38.41 kg/m  06/20/20 38.59 kg/m    Assessment/Interventions: Review of patient past medical history, allergies, medications, health status, including review of consultants reports, laboratory and other test data, was performed as part of comprehensive evaluation and provision of chronic care management services.   SDOH:  (Social Determinants of Health) assessments and interventions  performed: Yes  SDOH Screenings   Alcohol Screen: Low Risk    Last Alcohol Screening Score (AUDIT): 1  Depression (PHQ2-9): Medium Risk   PHQ-2 Score: 5  Financial Resource Strain: Not on file  Food Insecurity: Not on file  Housing: Not on file  Physical Activity: Not on file  Social Connections: Not on file  Stress: Not on file  Tobacco Use: Medium Risk   Smoking Tobacco Use: Former   Smokeless Tobacco Use: Never  Transportation Needs: Not on file    [place]  No Known Allergies  Medications Reviewed Today     Reviewed by Cathrine Muster, CMA (Certified Medical Assistant) on 10/04/20 at [place] List Status: <None>   Medication Order Taking? Sig Documenting Provider Last Dose Status Informant  albuterol (VENTOLIN HFA) 108 (90 Base) MCG/ACT inhaler [id number]  2 puffs every 4 to 6 hours as needed for asthma sx, and use 2 puffs prior to exercise for EIB Darren Grana, PA-C  Active   azelastine (ASTELIN) 0.1 % nasal spray [id number]  Place 1 spray into both nostrils 2 (two) times daily. Use in each nostril as directed Steele Sizer, MD  Active   fluticasone (FLONASE) 50 MCG/ACT nasal spray [id number]  Place 2 sprays into both nostrils daily. Hubbard Hartshorn, FNP  Active   Fluticasone Furoate (ARNUITY ELLIPTA) 100 MCG/ACT AEPB [id number]  Inhale 1 puff into the lungs daily. Steele Sizer, MD  Active   ipratropium-albuterol (DUONEB) 0.5-2.5 (3) MG/3ML SOLN [id number]  Take 3 mLs by nebulization every 4 (four) hours as needed. Hubbard Hartshorn, FNP  Active   levocetirizine (XYZAL) 5 MG tablet [id number]  Take 1 tablet (5 mg total) by mouth every evening. Steele Sizer, MD  Active   montelukast (SINGULAIR) 10 MG tablet [id number]  Take 1 tablet (10 mg total) by mouth at bedtime. Darren Grana, PA-C  Active   predniSONE (DELTASONE) 10 MG tablet [id number]  Take 1 tablet (10 mg total) by mouth 2 (two) times daily with a meal. Steele Sizer, MD  Active             Patient Active  Problem List   Diagnosis Date Noted   Asthma, moderate persistent, poorly-controlled 10/04/2020   Exercise induced bronchospasm 06/20/2020   COVID-19 03/08/2020   Excessive daytime sleepiness 05/26/2019   Dysthymia 05/26/2019   Moderate persistent asthma with acute exacerbation 11/27/2018   Class 2 obesity with body mass index (BMI) of 38.0 to 38.9 in adult 11/27/2018    There is no immunization history for the selected administration types on file for this patient.  Conditions to be addressed/monitored:  Asthma  There are no care plans that you recently modified to display for this patient.  Medication Assistance: Application for Trelegy  medication assistance program. in process.  Anticipated assistance start date TBD.  See plan of care for additional detail.  Patient's [facility] pharmacy [facility]:  [id number] [place] [address]: [phone]: [phone] [id number] [place]: [phone]: [phone] 1953Care Plan and Follow Up Patient Decision:  Patient requests no follow-up at this time.  Plan:  Patient does not qualify for CCM services. Please reconsult if additional aid is required.   AlDoristine SectionCPH[facility] [phone] Barriers:  Unable to independently afford treatment regimen  Pharmacist Clinical Goal(s):  Patient will verbalize ability to afford treatment regimen through collaboration with PharmD and provider.   Interventions: 1:1 collaboration with TaDelsa GranaPA-C regarding development and update of comprehensive plan of care as evidenced by provider attestation and co-signature Inter-disciplinary care team collaboration (see longitudinal plan of care) Comprehensive medication review performed; medication list updated in electronic medical  record  Asthma (Goal: control symptoms and prevent exacerbations) -Controlled -Current treatment  Albuterol 2 puffs every 4-6 hours as needed  Arnuity ellpita 1 puff daily  -Medications previously tried: NA  -Trelegy was unaffordable, not covered under insurance.  -Patient denies consistent use of maintenance inhaler due to cost  -Frequency of rescue inhaler use: multiple times daily  -Counseled on Benefits of consistent maintenance inhaler use -Recommended to continue current medication GSTetlin[place]ient Assistance form for Trelegy completed by patient and faxed for review on 11/03/20   Patient Goals/Self-Care Activities Patient will:  -  Gather required application materials and return to pharmacy   Follow Up Plan: Patient does not qualify for CCM services. Please reconsult if additional aid is required.

## 2020-10-17 ENCOUNTER — Telehealth: Payer: Self-pay

## 2020-10-17 NOTE — Telephone Encounter (Signed)
Pt stated Trinna Post was supposed to call him back to go over some financial questions, including the cost of his meds, please advise.

## 2020-10-17 NOTE — Telephone Encounter (Signed)
Copied from CRM [id number]. Topic: General - Other >> Oct 14, 2020  4:42 PM Gwenlyn Fudge wrote: Reason for CRM: Pt called stating that Trinna Post had reached out to him. Pt is requesting to have a call back. Please advise.

## 2020-10-19 NOTE — Telephone Encounter (Signed)
Spoke with patient regarding patient assistance for Trelegy

## 2020-11-03 MED ORDER — TRELEGY ELLIPTA 100-62.5-25 MCG/INH IN AEPB: 1.0000 | INHALATION_SPRAY | Freq: Every day | RESPIRATORY_TRACT | 11 refills | Status: DC

## 2020-11-09 ENCOUNTER — Telehealth: Payer: Self-pay

## 2020-11-09 DIAGNOSIS — J4540 Moderate persistent asthma, uncomplicated: Secondary | ICD-10-CM

## 2020-11-09 MED ORDER — TRELEGY ELLIPTA 100-62.5-25 MCG/INH IN AEPB: 1.0000 | INHALATION_SPRAY | Freq: Every day | RESPIRATORY_TRACT | 11 refills | Status: DC

## 2020-11-09 NOTE — Telephone Encounter (Signed)
error 

## 2020-11-09 NOTE — Telephone Encounter (Signed)
Can you resend, it says print

## 2020-11-09 NOTE — Telephone Encounter (Signed)
Copied from CRM [id number]. Topic: General - Other >> Nov 09, 2020  3:39 PM Marylen Ponto wrote: Reason for CRM: Pt stated his pharmacy told him that they have not received the Rx for Fluticasone-Umeclidin-Vilant (TRELEGY ELLIPTA) 100-62.5-25 MCG/INH AEPB. Pt requests that the [id number] [place], [place] - [address] Phone: [phone]  Fax: [phone]. Spoke with nurse triage and was advised to send request to the office.

## 2020-11-10 ENCOUNTER — Telehealth: Payer: Self-pay

## 2020-11-10 NOTE — Telephone Encounter (Signed)
Yes I sent a faxed a printed prescription to [employer] to see if we can get him approved for coverage for his Trelelgy going forward.   Thanks, Garey Ham, CPP Clinical Pharmacist [facility] [phone]

## 2020-11-10 NOTE — Telephone Encounter (Signed)
Copied from CRM [id number]. Topic: General - Other >> Nov 10, 2020  1:02 PM Gaetana Michaelis A wrote: Reason for CRM: Patient would like to speak with staff member "A. Fleury" regarding a prescription   The patient shares that they are having issues affording their Fluticasone-Umeclidin-Vilant (TRELEGY ELLIPTA) 100-62.5-25 MCG/INH AEPB  prescription and would like to discuss alternatives   Please contact further when possible

## 2020-11-10 NOTE — Telephone Encounter (Signed)
Pt notified, please let him know outcome once received back thanks

## 2020-11-11 ENCOUNTER — Ambulatory Visit: Payer: 59 | Admitting: Family Medicine

## 2020-12-07 ENCOUNTER — Telehealth: Payer: Self-pay

## 2020-12-07 NOTE — Progress Notes (Signed)
Please Void Note, Open in Error . 

## 2020-12-12 ENCOUNTER — Telehealth: Payer: Self-pay | Admitting: Family Medicine

## 2020-12-12 NOTE — Telephone Encounter (Signed)
Pt states he has called several times and not heard back. Pt state he does not have anymore Fluticasone-Umeclidin-Vilant (TRELEGY ELLIPTA) 100-62.5-25 MCG/INH AEPB  Pt state he was to get a cheaper way to get this med or an alternative. Pt states Trinna Post was to call him back.  [id number] [place], [place] - [address]

## 2020-12-12 NOTE — Telephone Encounter (Signed)
Pts next appt is 01/05/21

## 2021-01-05 ENCOUNTER — Other Ambulatory Visit: Payer: Self-pay | Admitting: Family Medicine

## 2021-01-05 ENCOUNTER — Ambulatory Visit: Payer: 59 | Admitting: Family Medicine

## 2021-01-05 DIAGNOSIS — J4541 Moderate persistent asthma with (acute) exacerbation: Secondary | ICD-10-CM

## 2021-01-05 DIAGNOSIS — J45990 Exercise induced bronchospasm: Secondary | ICD-10-CM

## 2021-01-05 DIAGNOSIS — J4540 Moderate persistent asthma, uncomplicated: Secondary | ICD-10-CM

## 2021-01-05 MED ORDER — ALBUTEROL SULFATE HFA 108 (90 BASE) MCG/ACT IN AERS: INHALATION_SPRAY | RESPIRATORY_TRACT | 5 refills | Status: DC

## 2021-01-05 NOTE — Telephone Encounter (Signed)
Pt was suppose to have an appt today but was called to reschedule to 9.14.22/ pt stated he is in need of a refill for his  albuterol (VENTOLIN HFA) 108 (90 Base) MCG/ACT inhaler  today/ please advise and send to  [id number] [place], [place] - [address] Phone:  [phone]  Fax:  [phone]

## 2021-01-11 ENCOUNTER — Other Ambulatory Visit: Payer: Self-pay

## 2021-01-11 ENCOUNTER — Ambulatory Visit (INDEPENDENT_AMBULATORY_CARE_PROVIDER_SITE_OTHER): Payer: 59 | Admitting: Family Medicine

## 2021-01-11 ENCOUNTER — Telehealth: Payer: Self-pay | Admitting: Family Medicine

## 2021-01-11 ENCOUNTER — Encounter: Payer: Self-pay | Admitting: Family Medicine

## 2021-01-11 VITALS — BP 122/84 | HR 79 | Temp 97.7°F | Resp 16 | Ht 66.0 in | Wt 243.5 lb

## 2021-01-11 DIAGNOSIS — J4540 Moderate persistent asthma, uncomplicated: Secondary | ICD-10-CM | POA: Diagnosis not present

## 2021-01-11 NOTE — Assessment & Plan Note (Signed)
Daily use of rescue inhaler despite consistent arnuity use. Trelegy samples provided. Will check with pharmacy team regarding patient assistance for trelegy. F/u in 3 months.

## 2021-01-11 NOTE — Progress Notes (Addendum)
    SUBJECTIVE:   CHIEF COMPLAINT / HPI:   Asthma - Medications: albuterol PRN, trelegy, singulair, claritin - working with pharmacy on getting patient assistance for trelegy. - Taking: arnuity (having trouble affording trelegy). Using albuterol daily.  - Common triggers: environmental allergies, exercise - ED visits/hospitalization in the last 6 months: no - Current symptoms: nighttime wheezing most nights. Often with chest tightness. Symptoms improved on trelegy. - no recent illnesses.   OBJECTIVE:   BP 122/84   Pulse 79   Temp 97.7 F (36.5 C)   Resp 16   Ht 5\' 6"  (1.676 m)   Wt 243 lb 8 oz (110.5 kg)   SpO2 98%   BMI 39.30 kg/m   Gen: well appearing, in NAD Card: RRR Lungs: end expiratory wheezing throughout.  Ext: WWP, no edema   ASSESSMENT/PLAN:   Asthma, moderate persistent, poorly-controlled Daily use of rescue inhaler despite consistent arnuity use. Trelegy samples provided. Will check with pharmacy team regarding patient assistance for trelegy. F/u in 3 months.     , DO  Addendum: patient denied for patient assistance for Trelegy through [employer] due to income. All other inhalers Tier 3 on insurance formulary and likely would still be cost-prohibitive. Patient looking into changing insurance for better coverage. Continue on trelegy samples and return to anurity once runs out.

## 2021-01-11 NOTE — Patient Instructions (Signed)
It was great to see you!  Our plans for today:  - Take the trelegy instead of the arnuity daily.  - I will let you know once I hear back from the pharmacist. - Come back for an inperson visit in 3 months.   Take care and seek immediate care sooner if you develop any concerns.   Dr. Linwood Dibbles

## 2021-01-11 NOTE — Telephone Encounter (Signed)
error 

## 2021-02-09 ENCOUNTER — Telehealth: Payer: Self-pay

## 2021-02-09 ENCOUNTER — Other Ambulatory Visit: Payer: Self-pay

## 2021-02-09 DIAGNOSIS — J4540 Moderate persistent asthma, uncomplicated: Secondary | ICD-10-CM

## 2021-02-09 MED ORDER — LEVALBUTEROL TARTRATE 45 MCG/ACT IN AERO: 2.0000 | INHALATION_SPRAY | RESPIRATORY_TRACT | 5 refills | Status: DC | PRN

## 2021-02-09 NOTE — Telephone Encounter (Signed)
Spoke to pt, no Trelegy samples at the moment until next week.

## 2021-02-14 ENCOUNTER — Telehealth: Payer: Self-pay | Admitting: Family Medicine

## 2021-02-14 NOTE — Telephone Encounter (Signed)
Pt has been waiting on Trelegy samples, see CRM [id number], samples have not come in, office calling drug rep, pt has been out of  ARNUITY ELLIPTA 100 MCG/ACT AEPB   01/07/2021    Sig - Route: Inhale 1 puff into the lungs daily. - Inhalation   the last 3 days. Pt states he has nothing. Pt requesting if a short term amount of the above could be called in till he can obtain some samples, fu with pt to advise [phone]

## 2021-02-15 ENCOUNTER — Other Ambulatory Visit: Payer: Self-pay | Admitting: Family Medicine

## 2021-02-15 MED ORDER — TRELEGY ELLIPTA 100-62.5-25 MCG/ACT IN AEPB: 1.0000 | INHALATION_SPRAY | Freq: Every day | RESPIRATORY_TRACT | 11 refills | Status: AC

## 2021-03-20 ENCOUNTER — Telehealth: Payer: Self-pay

## 2021-03-20 NOTE — Telephone Encounter (Signed)
Informed pt only have 1 sample at the moment he will be picking it up today.

## 2021-03-20 NOTE — Telephone Encounter (Signed)
Copied from CRM [id number]. Topic: General - Other >> Mar 20, 2021  8:51 AM Gaetana Michaelis A wrote: Reason for CRM: The patient would like to know if there are any samples of Fluticasone-Umeclidin-Vilant (TRELEGY ELLIPTA) 100-62.5-25 MCG/ACT AEPB available for them at the office  Please contact further when possible

## 2021-04-06 ENCOUNTER — Ambulatory Visit (INDEPENDENT_AMBULATORY_CARE_PROVIDER_SITE_OTHER): Payer: 59 | Admitting: Internal Medicine

## 2021-04-06 ENCOUNTER — Encounter: Payer: Self-pay | Admitting: Internal Medicine

## 2021-04-06 VITALS — BP 124/86 | HR 89 | Temp 98.1°F | Resp 16 | Ht 66.0 in | Wt 240.8 lb

## 2021-04-06 DIAGNOSIS — J4540 Moderate persistent asthma, uncomplicated: Secondary | ICD-10-CM | POA: Diagnosis not present

## 2021-04-06 DIAGNOSIS — J45990 Exercise induced bronchospasm: Secondary | ICD-10-CM | POA: Diagnosis not present

## 2021-04-06 MED ORDER — SPACER/AERO-HOLD CHAMBER BAGS MISC: 1.0000 | Freq: Two times a day (BID) | 0 refills | Status: AC

## 2021-04-06 MED ORDER — LEVALBUTEROL TARTRATE 45 MCG/ACT IN AERO: 2.0000 | INHALATION_SPRAY | RESPIRATORY_TRACT | 5 refills | Status: AC | PRN

## 2021-04-06 NOTE — Patient Instructions (Signed)
It was great seeing you today!  Plan discussed at today's visit: -Continue to use Trelegy , Albuterol as needed with spacer -Pulmonologist referral sent   Follow up in: 6 months   Take care and let us know if you have any questions or concerns prior to your next visit.  Dr. Caralee Ates

## 2021-04-06 NOTE — Progress Notes (Signed)
Established Patient Office Visit  Subjective:  Patient ID: Darren Kim, male    DOB: 05/27/1990  Age: 30 y.o. MRN: [id number]  CC:  Chief Complaint  Patient presents with   Follow-up   Asthma    HPI Darren Kim presents for follow-up on chronic medical conditions.  Asthma:  -Asthma status: uncontrolled -Current Treatments: Trelegy, Albuterol as needed -Satisfied with current treatment?: no -Albuterol/rescue inhaler frequency: uses about 2 a day  -Dyspnea frequency: no  -Wheezing frequency: yes mild -Cough frequency: no -Nocturnal symptom frequency: not anymore  -Limitation of activity: yes - hard time with endurance in the gym -Current upper respiratory symptoms: no -Aerochamber/spacer use: no -Visits to ER or Urgent Care in past year: no -Pneumovax:  discussed today, wants to talk about with pulmonologist    -Influenza: Not up to Date, discussed today, see above   Past Medical History:  Diagnosis Date   Asthma     No past surgical history on file.  No family history on file.  Social History   Socioeconomic History   Marital status: Single    Spouse name: Not on file   Number of children: Not on file   Years of education: 12   Highest education level: Some college, no degree  Occupational History   Occupation: self employeed  Tobacco Use   Smoking status: Former    Types: Cigarettes    Quit date: 04/26/2020    Years since quitting: 0.9   Smokeless tobacco: Never  Vaping Use   Vaping Use: Never used  Substance and Sexual Activity   Alcohol use: Not Currently   Drug use: Yes    Types: Marijuana   Sexual activity: Yes    Partners: Female    Birth control/protection: Condom  Other Topics Concern   Not on file  Social History Narrative   Not on file   Social Determinants of Health   Financial Resource Strain: Not on file  Food Insecurity: Not on file  Transportation Needs: Not on file  Physical Activity: Not on file  Stress: Not on  file  Social Connections: Not on file  Intimate Partner Violence: Not on file    Outpatient Medications Prior to Visit  Medication Sig Dispense Refill   levalbuterol (XOPENEX HFA) 45 MCG/ACT inhaler Inhale 2 puffs into the lungs every 4 (four) hours as needed for wheezing. 1 each 5   ARNUITY ELLIPTA 100 MCG/ACT AEPB Inhale 1 puff into the lungs daily.     fluticasone (FLONASE) 50 MCG/ACT nasal spray Place 2 sprays into both nostrils daily. 16 g 6   Fluticasone-Umeclidin-Vilant (TRELEGY ELLIPTA) 100-62.5-25 MCG/ACT AEPB Inhale 1 puff into the lungs daily. 1 each 11   montelukast (SINGULAIR) 10 MG tablet Take 1 tablet (10 mg total) by mouth at bedtime. 90 tablet 3   No facility-administered medications prior to visit.    No Known Allergies  ROS Review of Systems  Constitutional:  Negative for chills and fever.  HENT:  Negative for congestion and rhinorrhea.   Respiratory:  Positive for chest tightness and wheezing. Negative for cough and shortness of breath.   Cardiovascular:  Negative for chest pain and palpitations.     Objective:    Physical Exam Constitutional:      Appearance: Normal appearance.  HENT:     Head: Normocephalic and atraumatic.  Eyes:     Conjunctiva/sclera: Conjunctivae normal.  Cardiovascular:     Rate and Rhythm: Normal rate and regular rhythm.  Pulmonary:  Effort: Pulmonary effort is normal.     Comments: Inspiratory wheezes throughout Musculoskeletal:     Right lower leg: No edema.     Left lower leg: No edema.  Skin:    General: Skin is warm and dry.  Neurological:     Mental Status: He is alert.  Psychiatric:        Mood and Affect: Mood normal.        Behavior: Behavior normal.    BP 124/86   Pulse 89   Temp 98.1 F (36.7 C)   Resp 16   Ht 5\' 6"  (1.676 m)   Wt 240 lb 12.8 oz (109.2 kg)   SpO2 98%   BMI 38.87 kg/m  Wt Readings from Last 3 Encounters:  01/11/21 243 lb 8 oz (110.5 kg)  10/04/20 239 lb 3.2 oz (108.5 kg)   08/26/20 238 lb (108 kg)     Health Maintenance Due  Topic Date Due   COVID-19 Vaccine (1) Never done   Pneumococcal Vaccine 81-59 Years old (1 - PCV) Never done    There are no preventive care reminders to display for this patient.  No results found for: TSH Lab Results  Component Value Date   WBC 10.2 10/03/2016   HGB 18.1 (H) 10/03/2016   HCT 50.7 10/03/2016   MCV 83.6 10/03/2016   PLT 302 10/03/2016   Lab Results  Component Value Date   NA 141 11/27/2018   K 5.2 11/27/2018   CO2 18 (L) 11/27/2018   GLUCOSE 99 11/27/2018   BUN 21 11/27/2018   CREATININE 1.53 (H) 11/27/2018   BILITOT 1.3 (H) 11/27/2018   ALKPHOS 43 10/03/2016   AST 33 11/27/2018   ALT 53 (H) 11/27/2018   PROT 7.4 11/27/2018   ALBUMIN 4.1 10/03/2016   CALCIUM 10.2 11/27/2018   ANIONGAP 11 10/03/2016   Lab Results  Component Value Date   CHOL 145 11/27/2018   Lab Results  Component Value Date   HDL 42 11/27/2018   Lab Results  Component Value Date   LDLCALC 85 11/27/2018   Lab Results  Component Value Date   TRIG 88 11/27/2018   Lab Results  Component Value Date   CHOLHDL 3.5 11/27/2018   No results found for: HGBA1C    Assessment & Plan:   1. Asthma, moderate persistent, poorly-controlled/Exercise induced bronchospasm: States since having COVID in 2020, asthma has been harder to control and currently is uncontrolled. He was given 2 more samples of Trelegy today, Albuterol refilled. Referral to Pulmonology made to discuss other treatment options. Follow up here in 6 months or sooner as needed.   - Ambulatory referral to Pulmonology - levalbuterol [facility] HFA) 45 MCG/ACT inhaler; Inhale 2 puffs into the lungs every 4 (four) hours as needed for wheezing.  Dispense: 1 each; Refill: 5  Follow-up: Return in about 6 months (around 10/05/2021).    12/05/2021, DO

## 2021-05-04 ENCOUNTER — Telehealth: Payer: Self-pay

## 2021-05-04 NOTE — Telephone Encounter (Signed)
Spoke with Steward Drone and she will come by on Monday afternoon or Tuesday morning to bring the samples. She also suggested going onto the Trelegy website and click on savings & coupon fill out the information and with him being under 65 he should get it free for the duration of his prescription.

## 2021-05-04 NOTE — Telephone Encounter (Signed)
Pt notified, will try to do saving card

## 2021-05-04 NOTE — Telephone Encounter (Signed)
Can we see about getting samples please

## 2021-05-04 NOTE — Telephone Encounter (Signed)
Copied from CRM [id number]. Topic: General - Other >> May 04, 2021  1:02 PM Gaetana Michaelis A wrote: Reason for CRM: The patient has called to check with the office to see if any samples of Fluticasone-Umeclidin-Vilant (TRELEGY ELLIPTA) 100-62.5-25 MCG/ACT AEPB [[id number]]  are available  Please contact further when possible

## 2021-05-11 NOTE — Telephone Encounter (Signed)
Pt verbalizes understanding, has registered online for savings and also wants to be contacted about the samples in the office when they arrive next week.

## 2021-05-12 ENCOUNTER — Telehealth: Payer: Self-pay

## 2021-05-12 NOTE — Telephone Encounter (Signed)
Spoke with patient and informed him that we had samples of Trelegy ready for pick up at our front desk.  Patient stated that he would be here around lunch time to pick up.

## 2021-08-31 ENCOUNTER — Telehealth: Payer: Self-pay | Admitting: Family Medicine

## 2021-08-31 NOTE — Telephone Encounter (Signed)
Pt notified will get sample next week to call back to check ?

## 2021-08-31 NOTE — Telephone Encounter (Signed)
Pt called to see if the office gets any Fluticasone-Umeclidin-Vilant (TRELEGY ELLIPTA) 100-62.5-25 MCG/ACT / Pt would like any samples if they come in / please advise if any samples become available  ?

## 2021-09-15 NOTE — Telephone Encounter (Signed)
Pt called back checking status on Trelegy, they just come in the mail and I have placed one sample box up front for him. Pt will try to stop by today

## 2021-10-05 ENCOUNTER — Encounter: Payer: Self-pay | Admitting: Internal Medicine

## 2021-10-05 ENCOUNTER — Telehealth: Payer: Self-pay | Admitting: Internal Medicine

## 2021-10-05 ENCOUNTER — Ambulatory Visit (INDEPENDENT_AMBULATORY_CARE_PROVIDER_SITE_OTHER): Payer: Commercial Managed Care - PPO | Admitting: Internal Medicine

## 2021-10-05 VITALS — BP 134/82 | HR 91 | Resp 16 | Ht 66.0 in | Wt 237.0 lb

## 2021-10-05 DIAGNOSIS — J301 Allergic rhinitis due to pollen: Secondary | ICD-10-CM

## 2021-10-05 DIAGNOSIS — J4550 Severe persistent asthma, uncomplicated: Secondary | ICD-10-CM

## 2021-10-05 DIAGNOSIS — J4541 Moderate persistent asthma with (acute) exacerbation: Secondary | ICD-10-CM

## 2021-10-05 MED ORDER — CETIRIZINE HCL 10 MG PO TABS: 10.0000 mg | ORAL_TABLET | Freq: Every day | ORAL | 11 refills | Status: AC

## 2021-10-05 MED ORDER — MONTELUKAST SODIUM 10 MG PO TABS: 10.0000 mg | ORAL_TABLET | Freq: Every day | ORAL | 3 refills | Status: AC

## 2021-10-05 MED ORDER — METHYLPREDNISOLONE 4 MG PO TBPK
ORAL_TABLET | ORAL | 0 refills | Status: DC
Start: 2021-10-05 — End: 2022-11-27

## 2021-10-05 NOTE — Telephone Encounter (Signed)
Spoke with patient and he wanted to send over a list of pulmonologist that his insurance will cover in order to process his referral.  I sent patient a mychart activation code in order to send list to our office.  Pt stated that he would do that now and send via mychart.

## 2021-10-05 NOTE — Assessment & Plan Note (Signed)
Worse lately with poor air quality and seasonal allergies.  He was given 2 samples of Trelegy, continue to use albuterol as needed.  He was also given a Medrol Dosepak to help with current symptoms.  He was referred to pulmonology in the past, however his insurance did not cover this.  Recommend he call his insurance to find out which pulmonologist they will cover we can refer him there.  Follow-up in 3 months for recheck.

## 2021-10-05 NOTE — Progress Notes (Signed)
Established Patient Office Visit  Subjective:  Patient ID: Darren Kim, male    DOB: July 06, 1990  Age: 31 y.o. MRN: [id number]  CC:  Chief Complaint  Patient presents with   Follow-up    Would like to get off Trelegy    HPI Darren Sjogrenorre D Eschbach presents for follow-up on chronic medical conditions.  Asthma:  -Asthma status: uncontrolled -Current Treatments: Trelegy, Albuterol as needed but out of the Trelegy  -Satisfied with current treatment?: no -Albuterol/rescue inhaler frequency: uses about 4 times a day  -Dyspnea frequency: no  -Wheezing frequency: yes worse lately with poor air quality  -Cough frequency: no  -Nocturnal symptom frequency: sometimes when sleeping on chest or back  -Limitation of activity: yes - hard time with endurance in the gym -Current upper respiratory symptoms: no -Aerochamber/spacer use: no -Visits to ER or Urgent Care in past year: no -Pneumovax: Not UTD, referral placed to Pulmonology in the past but insurance didn't cover -Influenza: Not up to Date, discussed today, see above  Seasonal allergies: -Exacerbated currently.   -Had been on Singulair but out of this medication. -Uses Flonase occasionally -Does not use any oral antihistamines currently  Past Medical History:  Diagnosis Date   Asthma     No past surgical history on file.  No family history on file.  Social History   Socioeconomic History   Marital status: Single    Spouse name: Not on file   Number of children: Not on file   Years of education: 12   Highest education level: Some college, no degree  Occupational History   Occupation: self employeed  Tobacco Use   Smoking status: Former    Types: Cigarettes    Quit date: 04/26/2020    Years since quitting: 1.4   Smokeless tobacco: Never  Vaping Use   Vaping Use: Never used  Substance and Sexual Activity   Alcohol use: Not Currently   Drug use: Yes    Types: Marijuana   Sexual activity: Yes    Partners: Female     Birth control/protection: Condom  Other Topics Concern   Not on file  Social History Narrative   Not on file   Social Determinants of Health   Financial Resource Strain: Not on file  Food Insecurity: Not on file  Transportation Needs: Not on file  Physical Activity: Inactive (11/27/2018)   Exercise Vital Sign    Days of Exercise per Week: 0 days    Minutes of Exercise per Session: 0 min  Stress: Not on file  Social Connections: Not on file  Intimate Partner Violence: Not on file    Outpatient Medications Prior to Visit  Medication Sig Dispense Refill   fluticasone (FLONASE) 50 MCG/ACT nasal spray Place 2 sprays into both nostrils daily. 16 g 6   Fluticasone-Umeclidin-Vilant (TRELEGY ELLIPTA) 100-62.5-25 MCG/ACT AEPB Inhale 1 puff into the lungs daily. 1 each 11   levalbuterol (XOPENEX HFA) 45 MCG/ACT inhaler Inhale 2 puffs into the lungs every 4 (four) hours as needed for wheezing. 1 each 5   Spacer/Aero-Hold Chamber Bags MISC 1 each by Does not apply route in the morning and at bedtime. 1 each 0   ARNUITY ELLIPTA 100 MCG/ACT AEPB Inhale 1 puff into the lungs daily. (Patient not taking: Reported on 10/05/2021)     montelukast (SINGULAIR) 10 MG tablet Take 1 tablet (10 mg total) by mouth at bedtime. (Patient not taking: Reported on 10/05/2021) 90 tablet 3   No facility-administered medications prior to visit.  No Known Allergies  ROS Review of Systems  Constitutional:  Negative for chills and fever.  HENT:  Positive for postnasal drip and rhinorrhea. Negative for congestion.   Respiratory:  Positive for chest tightness and wheezing. Negative for cough and shortness of breath.   Cardiovascular:  Negative for chest pain and palpitations.      Objective:    Physical Exam Constitutional:      Appearance: Normal appearance.  HENT:     Head: Normocephalic and atraumatic.     Mouth/Throat:     Mouth: Mucous membranes are moist.     Pharynx: Oropharynx is clear.  Eyes:      Conjunctiva/sclera: Conjunctivae normal.  Cardiovascular:     Rate and Rhythm: Normal rate and regular rhythm.  Pulmonary:     Effort: Pulmonary effort is normal.     Breath sounds: Wheezing present. No rhonchi or rales.     Comments: Inspiratory wheezes throughout with poor air movement Musculoskeletal:     Right lower leg: No edema.     Left lower leg: No edema.  Skin:    General: Skin is warm and dry.  Neurological:     Mental Status: He is alert.  Psychiatric:        Mood and Affect: Mood normal.        Behavior: Behavior normal.     BP 134/82   Pulse 91   Resp 16   Ht 5\' 6"  (1.676 m)   Wt 237 lb (107.5 kg)   SpO2 98%   BMI 38.25 kg/m  Wt Readings from Last 3 Encounters:  10/05/21 237 lb (107.5 kg)  04/06/21 240 lb 12.8 oz (109.2 kg)  01/11/21 243 lb 8 oz (110.5 kg)     Health Maintenance Due  Topic Date Due   TETANUS/TDAP  Never done    There are no preventive care reminders to display for this patient.  No results found for: "TSH" Lab Results  Component Value Date   WBC 10.2 10/03/2016   HGB 18.1 (H) 10/03/2016   HCT 50.7 10/03/2016   MCV 83.6 10/03/2016   PLT 302 10/03/2016   Lab Results  Component Value Date   NA 141 11/27/2018   K 5.2 11/27/2018   CO2 18 (L) 11/27/2018   GLUCOSE 99 11/27/2018   BUN 21 11/27/2018   CREATININE 1.53 (H) 11/27/2018   BILITOT 1.3 (H) 11/27/2018   ALKPHOS 43 10/03/2016   AST 33 11/27/2018   ALT 53 (H) 11/27/2018   PROT 7.4 11/27/2018   ALBUMIN 4.1 10/03/2016   CALCIUM 10.2 11/27/2018   ANIONGAP 11 10/03/2016   Lab Results  Component Value Date   CHOL 145 11/27/2018   Lab Results  Component Value Date   HDL 42 11/27/2018   Lab Results  Component Value Date   LDLCALC 85 11/27/2018   Lab Results  Component Value Date   TRIG 88 11/27/2018   Lab Results  Component Value Date   CHOLHDL 3.5 11/27/2018   No results found for: "HGBA1C"    Assessment & Plan:   Problem List Items Addressed This  Visit       Respiratory   Severe persistent asthma without complication - Primary    Worse lately with poor air quality and seasonal allergies.  He was given 2 samples of Trelegy, continue to use albuterol as needed.  He was also given a Medrol Dosepak to help with current symptoms.  He was referred to pulmonology in the past, however  his insurance did not cover this.  Recommend he call his insurance to find out which pulmonologist they will cover we can refer him there.  Follow-up in 3 months for recheck.      Relevant Medications   methylPREDNISolone (MEDROL DOSEPAK) 4 MG TBPK tablet      Seasonal allergic rhinitis due to pollen    Symptoms exacerbated currently.  Start Zyrtec 10 mg at night as well as Singulair.  Both of these medications were refilled today.  Was also given samples of Ryaltris nasal spray.      Relevant Medications   montelukast (SINGULAIR) 10 MG tablet   cetirizine (ZYRTEC) 10 MG tablet    Follow-up: Return in about 3 months (around 01/05/2022).    Margarita Mail, DO

## 2021-10-05 NOTE — Telephone Encounter (Signed)
Copied from CRM [id number]. Topic: General - Other >> Oct 05, 2021 12:49 PM Ja-Kwan M wrote: Reason for CRM: Pt requests call back regarding a list that he was suppose to call in with. Pt did not provide any other information. Cb#  [phone]

## 2021-10-05 NOTE — Assessment & Plan Note (Signed)
Symptoms exacerbated currently.  Start Zyrtec 10 mg at night as well as Singulair.  Both of these medications were refilled today.  Was also given samples of Ryaltris nasal spray.

## 2021-10-05 NOTE — Patient Instructions (Addendum)
It was great seeing you today!  Plan discussed at today's visit: -Samples of Tregely given today -Continue Albuterol as needed  -Call insurance to see which Pulmonologist they will cover for you -Medrol dose pack sent to pharmacy  -Take Zyrtec at night for allergies and Singular  -Use nasal saline, then new nasal spray 2 sprays in each side twice a day   Follow up in: 3 months   Take care and let us know if you have any questions or concerns prior to your next visit.  Dr. Caralee Ates

## 2021-12-25 ENCOUNTER — Telehealth: Payer: Self-pay | Admitting: Internal Medicine

## 2021-12-25 NOTE — Telephone Encounter (Signed)
Pt notified sample up front

## 2021-12-25 NOTE — Telephone Encounter (Signed)
Patient stated he was told to call in to check on samples of Trelegy since he will be out soon. He says he usually does this every month. Please assist patient further

## 2022-01-04 ENCOUNTER — Ambulatory Visit: Payer: Commercial Managed Care - PPO | Admitting: Internal Medicine

## 2022-01-04 NOTE — Progress Notes (Deleted)
Established Patient Office Visit  Subjective:  Patient ID: Darren Kim, male    DOB: 09-19-1990  Age: 31 y.o. MRN: [id number]  CC:  No chief complaint on file.   HPI Darren Kim presents for follow-up on chronic medical conditions.  Asthma:  -Asthma status: uncontrolled -Current Treatments: Trelegy, Albuterol as needed but out of the Trelegy  -Satisfied with current treatment?: no -Albuterol/rescue inhaler frequency: uses about 4 times a day  -Dyspnea frequency: no  -Wheezing frequency: yes worse lately with poor air quality  -Cough frequency: no  -Nocturnal symptom frequency: sometimes when sleeping on chest or back  -Limitation of activity: yes - hard time with endurance in the gym -Current upper respiratory symptoms: no -Aerochamber/spacer use: no -Visits to ER or Urgent Care in past year: no -Pneumovax: Not UTD, referral placed to Pulmonology in the past but insurance didn't cover -Influenza: Not up to Date, discussed today, see above  Seasonal allergies: -Exacerbated currently.   -Had been on Singulair but out of this medication. -Uses Flonase occasionally -Does not use any oral antihistamines currently  Past Medical History:  Diagnosis Date   Asthma     No past surgical history on file.  No family history on file.  Social History   Socioeconomic History   Marital status: Single    Spouse name: Not on file   Number of children: Not on file   Years of education: 12   Highest education level: Some college, no degree  Occupational History   Occupation: self employeed  Tobacco Use   Smoking status: Former    Types: Cigarettes    Quit date: 04/26/2020    Years since quitting: 1.6   Smokeless tobacco: Never  Vaping Use   Vaping Use: Never used  Substance and Sexual Activity   Alcohol use: Not Currently   Drug use: Yes    Types: Marijuana   Sexual activity: Yes    Partners: Female    Birth control/protection: Condom  Other Topics Concern    Not on file  Social History Narrative   Not on file   Social Determinants of Health   Financial Resource Strain: Low Risk  (11/27/2018)   Overall Financial Resource Strain (CARDIA)    Difficulty of Paying Living Expenses: Not hard at all  Food Insecurity: No Food Insecurity (11/27/2018)   Hunger Vital Sign    Worried About Running Out of Food in the Last Year: Never true    Ran Out of Food in the Last Year: Never true  Transportation Needs: No Transportation Needs (11/27/2018)   PRAPARE - Administrator, Civil Service (Medical): No    Lack of Transportation (Non-Medical): No  Physical Activity: Inactive (11/27/2018)   Exercise Vital Sign    Days of Exercise per Week: 0 days    Minutes of Exercise per Session: 0 min  Stress: No Stress Concern Present (11/27/2018)   [employer] of Occupational Health - Occupational Stress Questionnaire    Feeling of Stress : Not at all  Social Connections: Unknown (01/02/2019)   Social Connection and Isolation Panel [NHANES]    Frequency of Communication with Friends and Family: More than three times a week    Frequency of Social Gatherings with Friends and Family: Never    Attends Religious Services: Not on Marketing executive or Organizations: Not on file    Attends Banker Meetings: Not on file    Marital Status: Never married  Intimate Partner Violence: Not At Risk (11/27/2018)   Humiliation, Afraid, Rape, and Kick questionnaire    Fear of Current or Ex-Partner: No    Emotionally Abused: No    Physically Abused: No    Sexually Abused: No    Outpatient Medications Prior to Visit  Medication Sig Dispense Refill   cetirizine (ZYRTEC) 10 MG tablet Take 1 tablet (10 mg total) by mouth daily. 30 tablet 11   fluticasone (FLONASE) 50 MCG/ACT nasal spray Place 2 sprays into both nostrils daily. 16 g 6   Fluticasone-Umeclidin-Vilant (TRELEGY ELLIPTA) 100-62.5-25 MCG/ACT AEPB Inhale 1 puff into the lungs daily. 1  each 11   levalbuterol (XOPENEX HFA) 45 MCG/ACT inhaler Inhale 2 puffs into the lungs every 4 (four) hours as needed for wheezing. 1 each 5   methylPREDNISolone (MEDROL DOSEPAK) 4 MG TBPK tablet Day 1: Take 8 mg (2 tablets) before breakfast, 4 mg (1 tablet) after lunch, 4 mg (1 tablet) after supper, and 8 mg (2 tablets) at bedtime. Day 2:Take 4 mg (1 tablet) before breakfast, 4 mg (1 tablet) after lunch, 4 mg (1 tablet) after supper, and 8 mg (2 tablets) at bedtime. Day 3: Take 4 mg (1 tablet) before breakfast, 4 mg (1 tablet) after lunch, 4 mg (1 tablet) after supper, and 4 mg (1 tablet) at bedtime. Day 4: Take 4 mg (1 tablet) before breakfast, 4 mg (1 tablet) after lunch, and 4 mg (1 tablet) at bedtime. Day 5: Take 4 mg (1 tablet) before breakfast and 4 mg (1 tablet) at bedtime. Day 6: Take 4 mg (1 tablet) before breakfast. 1 each 0   montelukast (SINGULAIR) 10 MG tablet Take 1 tablet (10 mg total) by mouth at bedtime. 90 tablet 3   Spacer/Aero-Hold Chamber Bags MISC 1 each by Does not apply route in the morning and at bedtime. 1 each 0   No facility-administered medications prior to visit.    No Known Allergies  ROS Review of Systems  Constitutional:  Negative for chills and fever.  HENT:  Positive for postnasal drip and rhinorrhea. Negative for congestion.   Respiratory:  Positive for chest tightness and wheezing. Negative for cough and shortness of breath.   Cardiovascular:  Negative for chest pain and palpitations.      Objective:    Physical Exam Constitutional:      Appearance: Normal appearance.  HENT:     Head: Normocephalic and atraumatic.     Mouth/Throat:     Mouth: Mucous membranes are moist.     Pharynx: Oropharynx is clear.  Eyes:     Conjunctiva/sclera: Conjunctivae normal.  Cardiovascular:     Rate and Rhythm: Normal rate and regular rhythm.  Pulmonary:     Effort: Pulmonary effort is normal.     Breath sounds: Wheezing present. No rhonchi or rales.      Comments: Inspiratory wheezes throughout with poor air movement Musculoskeletal:     Right lower leg: No edema.     Left lower leg: No edema.  Skin:    General: Skin is warm and dry.  Neurological:     Mental Status: He is alert.  Psychiatric:        Mood and Affect: Mood normal.        Behavior: Behavior normal.     There were no vitals taken for this visit. Wt Readings from Last 3 Encounters:  10/05/21 237 lb (107.5 kg)  04/06/21 240 lb 12.8 oz (109.2 kg)  01/11/21 243 lb 8 oz (  110.5 kg)     Health Maintenance Due  Topic Date Due   TETANUS/TDAP  Never done   INFLUENZA VACCINE  Never done    There are no preventive care reminders to display for this patient.  No results found for: "TSH" Lab Results  Component Value Date   WBC 10.2 10/03/2016   HGB 18.1 (H) 10/03/2016   HCT 50.7 10/03/2016   MCV 83.6 10/03/2016   PLT 302 10/03/2016   Lab Results  Component Value Date   NA 141 11/27/2018   K 5.2 11/27/2018   CO2 18 (L) 11/27/2018   GLUCOSE 99 11/27/2018   BUN 21 11/27/2018   CREATININE 1.53 (H) 11/27/2018   BILITOT 1.3 (H) 11/27/2018   ALKPHOS 43 10/03/2016   AST 33 11/27/2018   ALT 53 (H) 11/27/2018   PROT 7.4 11/27/2018   ALBUMIN 4.1 10/03/2016   CALCIUM 10.2 11/27/2018   ANIONGAP 11 10/03/2016   Lab Results  Component Value Date   CHOL 145 11/27/2018   Lab Results  Component Value Date   HDL 42 11/27/2018   Lab Results  Component Value Date   LDLCALC 85 11/27/2018   Lab Results  Component Value Date   TRIG 88 11/27/2018   Lab Results  Component Value Date   CHOLHDL 3.5 11/27/2018   No results found for: "HGBA1C"    Assessment & Plan:   Problem List Items Addressed This Visit       Respiratory   Severe persistent asthma without complication - Primary    Worse lately with poor air quality and seasonal allergies.  He was given 2 samples of Trelegy, continue to use albuterol as needed.  He was also given a Medrol Dosepak to help with  current symptoms.  He was referred to pulmonology in the past, however his insurance did not cover this.  Recommend he call his insurance to find out which pulmonologist they will cover we can refer him there.  Follow-up in 3 months for recheck.      Relevant Medications   methylPREDNISolone (MEDROL DOSEPAK) 4 MG TBPK tablet      Seasonal allergic rhinitis due to pollen    Symptoms exacerbated currently.  Start Zyrtec 10 mg at night as well as Singulair.  Both of these medications were refilled today.  Was also given samples of Ryaltris nasal spray.      Relevant Medications   montelukast (SINGULAIR) 10 MG tablet   cetirizine (ZYRTEC) 10 MG tablet    Follow-up: No follow-ups on file.    Margarita Mail, DO

## 2022-11-26 NOTE — Progress Notes (Unsigned)
   Acute Office Visit  Subjective:     Patient ID: Darren Kim, male    DOB: [birth year], 32 y.o.   MRN: [id number]  No chief complaint on file.   HPI Patient is in today for hand pain.   HAND PAIN Duration: {Blank single:19197::"days","weeks","months"} Involved hand: {Blank single:19197::"left","right","bilateral"} Mechanism of injury: {Blank single:19197::"trauma","FOOSH","unknown"} Location: {Blank single:19197::"dorsal","palmar","medial","lateral","diffuse"} Onset: {Blank single:19197::"sudden","gradual"} Severity: {Blank single:19197::"mild","moderate","severe","1/10","2/10","3/10","4/10","5/10","6/10","7/10","8/10","9/10","10/10"}  Quality: {Blank multiple:19196::"sharp","dull","aching","burning","cramping","ill-defined","itchy","pressure-like","pulling","shooting","sore","stabbing","tender","tearing","throbbing"} Frequency: {Blank single:19197::"constant","intermittent","occasional","rare","every few minutes","a few times a hour","a few times a day","a few times a week","a few times a month","a few times a year"} Radiation: {Blank single:19197::"yes","no"} Aggravating factors:  Alleviating factors:  Treatments attempted:  Relief with NSAIDs?: {Blank single:19197::"No NSAIDs Taken","no","mild","moderate","significant"} Weakness: {Blank single:19197::"yes","no"} Numbness: {Blank single:19197::"yes","no"} Redness: {Blank single:19197::"yes","no"} Swelling:{Blank single:19197::"yes","no"} Bruising: {Blank single:19197::"yes","no"} Fevers: {Blank single:19197::"yes","no"}   ROS      Objective:    There were no vitals taken for this visit. {Vitals History (Optional):23777}  Physical Exam  No results found for any visits on 11/27/22.      Assessment & Plan:   Problem List Items Addressed This Visit   None   No orders of the defined types were placed in this encounter.   No follow-ups on file.  Margarita Mail, DO

## 2022-11-27 ENCOUNTER — Encounter: Payer: Self-pay | Admitting: Internal Medicine

## 2022-11-27 ENCOUNTER — Ambulatory Visit (INDEPENDENT_AMBULATORY_CARE_PROVIDER_SITE_OTHER): Payer: Commercial Managed Care - PPO | Admitting: Internal Medicine

## 2022-11-27 VITALS — BP 120/84 | HR 81 | Temp 98.2°F | Resp 16 | Ht 66.75 in | Wt 241.9 lb

## 2022-11-27 DIAGNOSIS — L03114 Cellulitis of left upper limb: Secondary | ICD-10-CM | POA: Diagnosis not present

## 2022-11-27 MED ORDER — CEPHALEXIN 500 MG PO CAPS: 500.0000 mg | ORAL_CAPSULE | Freq: Four times a day (QID) | ORAL | 0 refills | Status: AC

## 2022-12-03 NOTE — Progress Notes (Unsigned)
   Acute Office Visit  Subjective:     Patient ID: Darren Kim, male    DOB: [birth year], 32 y.o.   MRN: [id number]  No chief complaint on file.   HPI Patient is in today to recheck hand cellulitis. Was seen last week, had itching on the wrist and palm. Skin was red and painful, started around wrist but now at the base of the thumb and over the MCP joints on the first-fourth digits. Could not make a fist, had some swelling over the thenar eminence. Denies trauma, animal bites or scratches.  No fevers, no other skin changes. Started on Keflex. Today he states that   Review of Systems  Constitutional:  Negative for chills and fever.  Skin: Negative.   Neurological:  Negative for tingling and weakness.        Objective:    There were no vitals taken for this visit. BP Readings from Last 3 Encounters:  11/27/22 120/84  10/05/21 134/82  04/06/21 124/86   Wt Readings from Last 3 Encounters:  11/27/22 241 lb 14.4 oz (109.7 kg)  10/05/21 237 lb (107.5 kg)  04/06/21 240 lb 12.8 oz (109.2 kg)      Physical Exam Constitutional:      Appearance: Normal appearance.  HENT:     Head: Normocephalic and atraumatic.  Eyes:     Conjunctiva/sclera: Conjunctivae normal.  Cardiovascular:     Rate and Rhythm: Normal rate and regular rhythm.  Pulmonary:     Effort: Pulmonary effort is normal.     Breath sounds: Normal breath sounds.  Musculoskeletal:        General: Swelling and tenderness present. Normal range of motion.     Comments: Left hand neurovascularly intact with decreased grip and pain to palpation at the base of the thumb and over the MCP joints   Skin:    General: Skin is warm and dry.     Findings: Erythema present.     Comments: Erythema and warmth across the palmar surface of the left hand  Neurological:     General: No focal deficit present.     Mental Status: He is alert. Mental status is at baseline.  Psychiatric:        Mood and Affect: Mood normal.         Behavior: Behavior normal.     No results found for any visits on 12/04/22.      Assessment & Plan:   1. Cellulitis of left upper extremity: Appears to be strep cellulitis with warmth, swelling and streaked appearance. Uncertain etiology. Will treat with Keflex, follow up in 1 week.   - cephALEXin (KEFLEX) 500 MG capsule; Take 1 capsule (500 mg total) by mouth 4 (four) times daily for 5 days.  Dispense: 20 capsule; Refill: 0   No follow-ups on file.  Margarita Mail, DO

## 2022-12-04 ENCOUNTER — Ambulatory Visit (INDEPENDENT_AMBULATORY_CARE_PROVIDER_SITE_OTHER): Payer: Commercial Managed Care - PPO | Admitting: Internal Medicine

## 2022-12-04 ENCOUNTER — Encounter: Payer: Self-pay | Admitting: Internal Medicine

## 2022-12-04 VITALS — BP 126/78 | HR 89 | Temp 97.9°F | Resp 18 | Ht 66.75 in | Wt 238.9 lb

## 2022-12-04 DIAGNOSIS — M7989 Other specified soft tissue disorders: Secondary | ICD-10-CM | POA: Diagnosis not present

## 2022-12-04 LAB — SEDIMENTATION RATE: Sed Rate: 6 mm/h (ref 0–15)

## 2023-08-19 ENCOUNTER — Emergency Department (HOSPITAL_BASED_OUTPATIENT_CLINIC_OR_DEPARTMENT_OTHER)
Admission: EM | Admit: 2023-08-19 | Discharge: 2023-08-20 | Disposition: A | Discharge status: Home / Self Care | Attending: Emergency Medicine | Admitting: Emergency Medicine

## 2023-08-19 ENCOUNTER — Encounter (HOSPITAL_BASED_OUTPATIENT_CLINIC_OR_DEPARTMENT_OTHER): Payer: Self-pay

## 2023-08-19 ENCOUNTER — Other Ambulatory Visit: Payer: Self-pay

## 2023-08-19 ENCOUNTER — Emergency Department (HOSPITAL_BASED_OUTPATIENT_CLINIC_OR_DEPARTMENT_OTHER)

## 2023-08-19 DIAGNOSIS — R0789 Other chest pain: Secondary | ICD-10-CM | POA: Diagnosis present

## 2023-08-19 DIAGNOSIS — J45909 Unspecified asthma, uncomplicated: Secondary | ICD-10-CM | POA: Diagnosis not present

## 2023-08-19 MED ORDER — ALBUTEROL SULFATE (2.5 MG/3ML) 0.083% IN NEBU
2.5000 mg | INHALATION_SOLUTION | Freq: Once | RESPIRATORY_TRACT | Status: DC
  Filled 2023-08-19: qty 3

## 2023-08-19 MED ORDER — KETOROLAC TROMETHAMINE 30 MG/ML IJ SOLN
30.0000 mg | Freq: Once | INTRAMUSCULAR | Status: AC
  Administered 2023-08-20: 30 mg via INTRAVENOUS
  Filled 2023-08-19: qty 1

## 2023-08-19 MED ORDER — MORPHINE SULFATE (PF) 4 MG/ML IV SOLN
4.0000 mg | Freq: Once | INTRAVENOUS | Status: AC
  Administered 2023-08-20: 4 mg via INTRAVENOUS
  Filled 2023-08-19: qty 1

## 2023-08-19 NOTE — ED Triage Notes (Signed)
 Assisted pt to wc from car Friend states pt was helping him put tires on a vehicle on Sat. Pt woke up Sunday with Muscular pain Was seen at urgent care and was given Toradol  IM Pt presents tonight with CP muscular pain Also reports having SHOB, hx of asthma Wheezing in triage

## 2023-08-19 NOTE — ED Provider Notes (Signed)
 [name] EMERGENCY DEPARTMENT AT [facility]  Provider Note  CSN: [id number] Arrival date & time: 08/19/23 2321  History Chief Complaint  Patient presents with   Chest Pain    Darren Kim is a 33 y.o. male with history of severe asthma, on Trelegy, reports onset of sharp/aching anterior chest pain yesterday after moving some tires. He went to UC where an EKG was reportedly normal and given a Toradol  injection which helped some for a brief time. Pain has been having increased pain today, worse with movement and deep breath. No cough or fever. Does not feel like previous asthma exacerbations.    Home Medications Prior to Admission medications   Medication Sig Start Date End Date Taking? Authorizing Provider  HYDROcodone -acetaminophen  (NORCO/VICODIN) 5-325 MG tablet Take 1 tablet by mouth every 6 (six) hours as needed for severe pain (pain score 7-10). 08/20/23  Yes Charmayne Cooper, MD  naproxen  (NAPROSYN ) 500 MG tablet Take 1 tablet (500 mg total) by mouth 2 (two) times daily. 08/20/23  Yes Charmayne Cooper, MD  cetirizine  (ZYRTEC ) 10 MG tablet Take 1 tablet (10 mg total) by mouth daily. 10/05/21   Rockney Cid, DO  FASENRA PEN 30 MG/ML prefilled autoinjector Inject into the skin.    [provider]  Fluticasone -Umeclidin-Vilant (TRELEGY ELLIPTA ) 100-62.5-25 MCG/ACT AEPB Inhale 1 puff into the lungs daily. 02/15/21   Rumball, Alison M, DO  levalbuterol  (XOPENEX  HFA) 45 MCG/ACT inhaler Inhale 2 puffs into the lungs every 4 (four) hours as needed for wheezing. 04/06/21   Rockney Cid, DO  montelukast  (SINGULAIR ) 10 MG tablet Take 1 tablet (10 mg total) by mouth at bedtime. 10/05/21   Rockney Cid, DO  Spacer/Aero-Hold Chamber Bags MISC 1 each by Does not apply route in the morning and at bedtime. 04/06/21   Rockney Cid, DO     Allergies    Patient has no known allergies.   Review of Systems   Review of Systems Please see HPI for pertinent  positives and negatives  Physical Exam BP 130/88   Pulse 76   Temp 98.2 F (36.8 C) (Temporal)   Resp 16   Ht 5\' 6"  (1.676 m)   Wt 108.4 kg   SpO2 100%   BMI 38.58 kg/m   Physical Exam Vitals and nursing note reviewed.  Constitutional:      Appearance: Normal appearance.  HENT:     Head: Normocephalic and atraumatic.     Nose: Nose normal.     Mouth/Throat:     Mouth: Mucous membranes are moist.  Eyes:     Extraocular Movements: Extraocular movements intact.     Conjunctiva/sclera: Conjunctivae normal.  Cardiovascular:     Rate and Rhythm: Normal rate.  Pulmonary:     Effort: Pulmonary effort is normal. No respiratory distress.     Breath sounds: Wheezing (mild, end expiratory) present.  Chest:     Chest wall: Tenderness (chest wall is exquisitely tender to light and deep touch) present.  Abdominal:     General: Abdomen is flat.     Palpations: Abdomen is soft.     Tenderness: There is no abdominal tenderness.  Musculoskeletal:        General: No swelling. Normal range of motion.     Cervical back: Neck supple.     Right lower leg: No edema.     Left lower leg: No edema.  Skin:    General: Skin is warm and dry.  Neurological:  General: No focal deficit present.     Mental Status: He is alert.  Psychiatric:        Mood and Affect: Mood normal.     ED Results / Procedures / Treatments   EKG EKG Interpretation Date/Time:  Monday August 19 2023 23:40:20 EDT Ventricular Rate:  88 PR Interval:  136 QRS Duration:  101 QT Interval:  349 QTC Calculation: 423 R Axis:   51  Text Interpretation: Sinus rhythm Borderline repolarization abnormality Nonspecific ST and T wave abnormality Since last tracing Inferior ST-T wave changes are more prominent Confirmed by Shawnee Dellen [phone]) on 08/19/2023 11:50:32 PM  Procedures Procedures  Medications Ordered in the ED Medications  alum & mag hydroxide-simeth (MAALOX/MYLANTA) 200-200-20 MG/5ML suspension 30 mL (has  no administration in time range)  ketorolac  (TORADOL ) 30 MG/ML injection 30 mg (30 mg Intravenous Given 08/20/23 0019)  morphine  (PF) 4 MG/ML injection 4 mg (4 mg Intravenous Given 08/20/23 0020)  albuterol  (PROVENTIL ) (2.5 MG/3ML) 0.083% nebulizer solution 2.5 mg (2.5 mg Nebulization Given 08/20/23 0107)    Initial Impression and Plan  Patient here with chest pain most consistent with MSK etiology, less likely ACS, PTX or PNA. Will check EKG, labs and CXR. Pain meds for comfort.   ED Course   Clinical Course as of 08/20/23 0352  Tue Aug 20, 2023  0054 CBC is normal.  [CS]  0110 BMP, Trop and dimer are normal.  [CS]  0332 I personally viewed the images from radiology studies and awaiting radiologist interpretation: pCXR with poor inspiration but no focal consolidation [CS]  0341 Repeat trop remains normal.  [CS]  0348 Patient reports pain improved but not resolved. He is breathing better and resting more comfortably. Requesting something for discomfort with swallowing. Otherwise no emergent cause of his symptoms identified. Most likely MSK pain. Plan Rx for naprosyn , norco for breakthrough. Recommend rest and PCP follow up, RTED for any other concerns.   [CS]    Clinical Course User Index [CS] Charmayne Cooper, MD     MDM Rules/Calculators/A&P Medical Decision Making Given presenting complaint, I considered that admission might be necessary. After review of results from ED lab and/or imaging studies, admission to the hospital is not indicated at this time.    Problems Addressed: Chest wall pain: acute illness or injury  Amount and/or Complexity of Data Reviewed Labs: ordered. Decision-making details documented in ED Course. Radiology: ordered and independent interpretation performed. Decision-making details documented in ED Course. ECG/medicine tests: ordered and independent interpretation performed. Decision-making details documented in ED Course.  Risk OTC drugs. Prescription  drug management. Parenteral controlled substances.     Final Clinical Impression(s) / ED Diagnoses Final diagnoses:  Chest wall pain    Rx / DC Orders ED Discharge Orders          Ordered    naproxen  (NAPROSYN ) 500 MG tablet  2 times daily        08/20/23 0351    HYDROcodone -acetaminophen  (NORCO/VICODIN) 5-325 MG tablet  Every 6 hours PRN        08/20/23 0351             Charmayne Cooper, MD 08/20/23 [phone]

## 2023-08-20 LAB — CBC WITH DIFFERENTIAL/PLATELET
Abs Immature Granulocytes: 0.02 10*3/uL (ref 0.00–0.07)
Basophils Absolute: 0 10*3/uL (ref 0.0–0.1)
Basophils Relative: 0 %
Eosinophils Absolute: 0.3 10*3/uL (ref 0.0–0.5)
Eosinophils Relative: 3 %
HCT: 45 % (ref 39.0–52.0)
Hemoglobin: 15.6 g/dL (ref 13.0–17.0)
Immature Granulocytes: 0 %
Lymphocytes Relative: 35 %
Lymphs Abs: 3.3 10*3/uL (ref 0.7–4.0)
MCH: 28.7 pg (ref 26.0–34.0)
MCHC: 34.7 g/dL (ref 30.0–36.0)
MCV: 82.9 fL (ref 80.0–100.0)
Monocytes Absolute: 0.7 10*3/uL (ref 0.1–1.0)
Monocytes Relative: 8 %
Neutro Abs: 5.1 10*3/uL (ref 1.7–7.7)
Neutrophils Relative %: 54 %
Platelets: 351 10*3/uL (ref 150–400)
RBC: 5.43 MIL/uL (ref 4.22–5.81)
RDW: 13.4 % (ref 11.5–15.5)
WBC: 9.5 10*3/uL (ref 4.0–10.5)
nRBC: 0 % (ref 0.0–0.2)

## 2023-08-20 LAB — TROPONIN I (HIGH SENSITIVITY)
Troponin I (High Sensitivity): 3 ng/L (ref <18)
Troponin I (High Sensitivity): 3 ng/L (ref <18)

## 2023-08-20 LAB — BASIC METABOLIC PANEL WITH GFR
Anion gap: 10 (ref 5–15)
BUN: 14 mg/dL (ref 6–20)
CO2: 23 mmol/L (ref 22–32)
Calcium: 9.6 mg/dL (ref 8.9–10.3)
Chloride: 104 mmol/L (ref 98–111)
Creatinine, Ser: 1.21 mg/dL (ref 0.61–1.24)
GFR, Estimated: >60 mL/min (ref >60)
Glucose, Bld: 84 mg/dL (ref 70–99)
Potassium: 4.2 mmol/L (ref 3.5–5.1)
Sodium: 137 mmol/L (ref 135–145)

## 2023-08-20 LAB — D-DIMER, QUANTITATIVE: D-Dimer, Quant: <0.27 ug{FEU}/mL (ref 0.00–0.50)

## 2023-08-20 MED ORDER — NAPROXEN 500 MG PO TABS
500.0000 mg | ORAL_TABLET | Freq: Two times a day (BID) | ORAL | 0 refills | Status: AC
Start: 2023-08-20 — End: ?

## 2023-08-20 MED ORDER — HYDROCODONE-ACETAMINOPHEN 5-325 MG PO TABS: 1.0000 | ORAL_TABLET | Freq: Four times a day (QID) | ORAL | 0 refills | Status: AC | PRN

## 2023-08-20 MED ORDER — ALUM & MAG HYDROXIDE-SIMETH 200-200-20 MG/5ML PO SUSP
30.0000 mL | Freq: Once | ORAL | Status: AC
  Administered 2023-08-20: 30 mL via ORAL
  Filled 2023-08-20: qty 30

## 2023-08-20 MED ORDER — ALBUTEROL SULFATE (2.5 MG/3ML) 0.083% IN NEBU
2.5000 mg | INHALATION_SOLUTION | Freq: Once | RESPIRATORY_TRACT | Status: AC
  Administered 2023-08-20: 2.5 mg via RESPIRATORY_TRACT
  Filled 2023-08-20: qty 3

## 2023-08-20 NOTE — ED Notes (Signed)
 Family at bedside.
